# Patient Record
Sex: Female | Born: 1945 | Race: White | Hispanic: No | Smoking: Never smoker
Health system: Southern US, Community
[De-identification: ages and names within clinical notes are randomized; demographics above are authoritative.]

## PROBLEM LIST (undated history)

## (undated) DIAGNOSIS — I341 Nonrheumatic mitral (valve) prolapse: Secondary | ICD-10-CM

## (undated) DIAGNOSIS — C569 Malignant neoplasm of unspecified ovary: Secondary | ICD-10-CM

## (undated) DIAGNOSIS — K56609 Unspecified intestinal obstruction, unspecified as to partial versus complete obstruction: Secondary | ICD-10-CM

## (undated) HISTORY — PX: CHOLECYSTECTOMY: SHX55

## (undated) HISTORY — DX: Nonrheumatic mitral (valve) prolapse: I34.1

## (undated) HISTORY — PX: ABDOMINAL HYSTERECTOMY: SHX81

---

## 2000-10-01 ENCOUNTER — Emergency Department (HOSPITAL_COMMUNITY): Admission: EM | Admit: 2000-10-01 | Discharge: 2000-10-02 | Payer: Self-pay | Admitting: Emergency Medicine

## 2003-04-07 ENCOUNTER — Encounter (INDEPENDENT_AMBULATORY_CARE_PROVIDER_SITE_OTHER): Payer: Self-pay | Admitting: Specialist

## 2003-04-07 ENCOUNTER — Ambulatory Visit (HOSPITAL_COMMUNITY): Admission: RE | Admit: 2003-04-07 | Discharge: 2003-04-07 | Payer: Self-pay | Admitting: *Deleted

## 2003-08-10 ENCOUNTER — Encounter: Admission: RE | Admit: 2003-08-10 | Discharge: 2003-08-10 | Payer: Self-pay | Admitting: Family Medicine

## 2003-10-20 ENCOUNTER — Encounter: Admission: RE | Admit: 2003-10-20 | Discharge: 2003-10-20 | Payer: Self-pay | Admitting: *Deleted

## 2003-10-26 ENCOUNTER — Encounter: Admission: RE | Admit: 2003-10-26 | Discharge: 2003-10-26 | Payer: Self-pay | Admitting: Family Medicine

## 2004-07-24 ENCOUNTER — Other Ambulatory Visit: Admission: RE | Admit: 2004-07-24 | Discharge: 2004-07-24 | Payer: Self-pay | Admitting: Family Medicine

## 2005-04-08 HISTORY — PX: ABDOMINAL EXPLORATION SURGERY: SHX538

## 2006-01-07 ENCOUNTER — Inpatient Hospital Stay (HOSPITAL_COMMUNITY): Admission: EM | Admit: 2006-01-07 | Discharge: 2006-01-15 | Payer: Self-pay | Admitting: Emergency Medicine

## 2006-10-21 ENCOUNTER — Encounter: Admission: RE | Admit: 2006-10-21 | Discharge: 2006-10-21 | Payer: Self-pay | Admitting: Family Medicine

## 2007-12-15 ENCOUNTER — Encounter: Admission: RE | Admit: 2007-12-15 | Discharge: 2007-12-15 | Payer: Self-pay | Admitting: Family Medicine

## 2008-07-15 ENCOUNTER — Other Ambulatory Visit: Admission: RE | Admit: 2008-07-15 | Discharge: 2008-07-15 | Payer: Self-pay | Admitting: Family Medicine

## 2009-10-17 ENCOUNTER — Encounter: Admission: RE | Admit: 2009-10-17 | Discharge: 2009-10-17 | Payer: Self-pay | Admitting: Family Medicine

## 2010-04-28 ENCOUNTER — Encounter: Payer: Self-pay | Admitting: Family Medicine

## 2010-04-29 ENCOUNTER — Encounter: Payer: Self-pay | Admitting: Family Medicine

## 2010-08-24 NOTE — Discharge Summary (Signed)
NAMEMICHAELLE, Zoe Jackson             ACCOUNT NO.:  [id number]   MEDICAL RECORD NO.:  [id number]          PATIENT TYPE:  INP   LOCATION:  1319                         FACILITY:  [facility]   PHYSICIAN:  Angelia Mould. Derrell Lolling, M.D.DATE OF BIRTH:  06-15-1945   DATE OF ADMISSION:  01/07/2006  DATE OF DISCHARGE:  01/15/2006                                 DISCHARGE SUMMARY   FINAL DIAGNOSIS:  1. Mechanical small-bowel obstruction secondary to adhesions.  2. Remote history of ovarian cancer, 1968, no known recurrence.   OPERATIONS PERFORMED:  Exploratory laparotomy with lysis of adhesions, date  January 09, 2006.   HISTORY:  This is a 65 year old white female in excellent health.  She  presented with a 24-hour history of crampy centralized abdominal pain; and  repeated episodes of nausea and vomiting.  She had not had a bowel movement  for over 12 hours.  She came to the emergency room and had CT scan which  suggested a distal small bowel obstruction; and a small amount of pelvic  ascites.  Gallstones were noted, but the gallbladder was not inflamed  radiographically.  I was called see the patient in the emergency room.   PHYSICAL EXAM:  GENERAL:  A pleasant middle-aged woman who was not in any  major distress.  VITAL SIGNS:  Temperature 97.3, pulse 85, respirations 18, blood pressure  138/74.  FOCUSED EXAM:  Significant physical findings limited to the abdomen which  was soft, minimally tender.  No guarding and no rebound; not obviously  distended.  Bowel sounds were hypoactive.  There was a lower midline scar  well healed.  No masses, no hernias noted.   ADMISSION DATA:  White blood cell count 12,000, potassium 3.4, amylase and  lipase were normal.  Otherwise normal labs.   HOSPITAL COURSE:  The patient was admitted for observation.  Because of the  radiographic suggestion of small-bowel obstruction we inserted the NG tube.  On October 3 she had felt okay; was not having any pain, nausea,  or  vomiting, but had not passed any flatus or stool; and NG output was  moderate.  Lab work showed a white count 11,800.  Conservative therapy was  continued.  On October 4 she was having occasional mild cramps.  NG drainage  was somewhat more.  Her abdomen was soft and not obviously distended; not  really tender, but she had almost no bowel sounds.  Her x-rays suggested  small-bowel obstruction.  I felt that she was obstructed; and advised  surgical intervention.  She was taken to operating room on October 4 and was  found to have a mechanical small-bowel obstruction due to a closed loop  obstruction of the distal ileum.  The mesentery was somewhat hemorrhagic,  but the bowel was viable.  There was no signs of any ischemia or cancer.  The gallbladder and appendix were examined they were not inflamed.  We  simply took all of the adhesions down; and that is all that was necessary.   Postoperatively the patient did well.  She had an ileus for a few days.  The  bowel sounds  returned on postop day #3 and the nasogastric tube was removed.  Over the next two or three days her bowel function returned; she began  having bowel movements and tolerating a diet; and she was ready for  discharge on January 15, 2006.  At that time  her wound looked fine; the abdomen was flat and soft and she had good bowel  sounds.  There were no signs of any infection.  She was given a prescription  for Vicodin for pain. Diet and activities were discussed.  She was asked to  followup with me in the office in 1 week.      Angelia Mould. Derrell Lolling, M.D.  Electronically Signed     HMI/MEDQ  D:  02/03/2006  T:  02/03/2006  Job:  [id number]   cc:   Talmadge Coventry, M.D.  Fax: [phone]

## 2010-08-24 NOTE — Op Note (Signed)
NAME:  Zoe Jackson, Zoe Jackson                       ACCOUNT NO.:  [id number]   MEDICAL RECORD NO.:  [id number]                   PATIENT TYPE:  AMB   LOCATION:  ENDO                                 FACILITY:  [facility]   PHYSICIAN:  Graylin Shiver, M.D.                DATE OF BIRTH:  1945-07-23   DATE OF PROCEDURE:  DATE OF DISCHARGE:                                 OPERATIVE REPORT   PROCEDURE:  Colonoscopy.   INDICATIONS FOR PROCEDURE:  Heme-positive stool.   Informed consent was obtained after explanation of the risks of bleeding,  infection and perforation.   PREMEDICATION:  Fentanyl 100 mcg IV, Versed 9 mg IV.   DESCRIPTION OF PROCEDURE:  With the patient in the left lateral decubitus  position, a rectal exam was performed and no masses were felt. The Olympus  colonoscope was then inserted into the rectum and advanced around the colon  to the cecum. Cecal landmarks were identified. The cecum and ascending colon  were normal. The transverse colon was normal.  The descending colon  ___________. In the sigmoid, there was a small 3-4 mm raised area of mucosa  which had a __________ to the mucosa. This might be a small polyp. This was  biopsied off with cold forceps. The rectum looked normal.  She tolerated the  procedure well without complications.   IMPRESSION:  Possible polyps of the sigmoid colon, pathology pending.                                               Graylin Shiver, M.D.    [name]  D:  04/07/2003  T:  04/07/2003  Job:  [id number]   cc:   Dr. ____________________

## 2010-08-24 NOTE — H&P (Signed)
NAMEKLARE, CRISS             ACCOUNT NO.:  [id number]   MEDICAL RECORD NO.:  [id number]          PATIENT TYPE:  INP   LOCATION:  0102                         FACILITY:  [facility]   PHYSICIAN:  Angelia Mould. Derrell Lolling, M.D.DATE OF BIRTH:  1945/06/23   DATE OF ADMISSION:  01/07/2006  DATE OF DISCHARGE:                                HISTORY & PHYSICAL   CHIEF COMPLAINT:  Abdominal pain and vomiting.   HISTORY OF PRESENT ILLNESS:  This is a 65 year old white female who enjoys  excellent health.  She claims that she was feeling well.  Yesterday at 3  o'clock p.m. she drank some orange juice and developed central periumbilical  abdominal pain, shortly thereafter nausea and vomiting.  Sounds like she had  vomiting and dry heaves repeatedly, but it does not sound like she had a  very high volume of emesis.  She had a normal bowel movement and flatus  yesterday but really has not had any bowel movement or flatus for the past  12 hours.  She has not had any similar episodes in the past.  She came to  emergency room at midnight last night.  She had a CT scan which suggests a  distal small-bowel obstruction, some pelvic ascites although this is not  high volume, and gallstones but the gallbladder does not look inflamed.  We  were called at 7 o'clock a.m. to evaluate the patient.  I saw the patient at  7:30 a.m. and again at 9:30 a.m.Marland Kitchen  She is being admitted for further  evaluation and management.   PAST HISTORY:  She states that in 1968 she had ovarian cancer and had a  hysterectomy and bilateral salpingo-oophorectomy.  There has been no  recurrence.  She has no other medical or surgical problems.   CURRENT MEDICATIONS:  None.   ALLERGIES:  NO KNOWN DRUG ALLERGIES.   SOCIAL HISTORY:  [place]  She is married.  They do not have any children.  She is self-employed as a Advertising copywriter.  Denies tobacco.  Drinks wine occasionally.   FAMILY HISTORY:  Mother living, has  Parkinson's disease and hypertension.  Father living, has hypertension.   REVIEW OF SYSTEMS:  All systems reviewed.  They are noncontributory except  as described above.   PHYSICAL EXAMINATION:  GENERAL APPEARANCE:  Pleasant middle-aged woman who  really does not appear to be in any distress.  She appears healthy.  VITAL SIGNS:  Temperature 97.3, pulse 85, respirations 18, blood pressure  138/74.  HEENT:  Eyes:  Sclerae clear.  Extraocular movements intact.  Ears, nose  mouth and throat:  Nose, lips, tongue and oropharynx are without gross  lesion.  NECK:  Supple, nontender.  No thyroid mass.  No adenopathy.  No jugular  venous distention.  LUNGS:  Clear to auscultation.  No chest wall tenderness.  HEART:  Regular rate and rhythm.  Radial and femoral pulses are palpable.  No peripheral edema.  BREASTS:  Not examined.  ABDOMEN:  Soft.  Minimally tender.  No guarding, no rebound not obviously  distended.  Bowel sounds are hypoactive, not  obstructive sounding.  There is  a lower midline scar which is well-healed.  I do not feel any mass in the  abdomen.  Liver and spleen are not enlarged.  I do not feel any inguinal  adenopathy or hernia or mass.  EXTREMITIES:  She moves all four extremities well without pain or deformity.  NEUROLOGIC:  No gross motor or sensory deficits.   ADMISSION LABORATORY DATA:  CT scan as described above.   Hemoglobin 14.4, white blood cell count 12,000.  Complete metabolic panel  reveals potassium of 3.4, normal liver function tests.  Amylase of 71,  lipase of 29 and an unremarkable urinalysis.   ASSESSMENT:  1. Abdominal pain and vomiting.  This may be due to a partial small-bowel      obstruction or a gastroenteritis or viral illness.  2. Remote history of ovarian cancer.  Specific details unknown, no known      recurrence today.  3. Gallstones, suspect these are asymptomatic.   PLAN:  1. The patient will be admitted to the hospital for IV fluid  hydration and      nasogastric tube decompression.  2. We will repeat her clinical evaluation in 12-24 hours with lab and x-      ray and physical exam.      Angelia Mould. Derrell Lolling, M.D.  Electronically Signed     HMI/MEDQ  D:  01/07/2006  T:  01/08/2006  Job:  [id number]   cc:   Talmadge Coventry, M.D.  Fax: [phone]

## 2010-08-24 NOTE — Op Note (Signed)
Zoe Jackson, Zoe Jackson             ACCOUNT NO.:  [id number]   MEDICAL RECORD NO.:  [id number]          PATIENT TYPE:  INP   LOCATION:  1319                         FACILITY:  [facility]   PHYSICIAN:  Angelia Mould. Derrell Lolling, M.D.DATE OF BIRTH:  1946-02-27   DATE OF PROCEDURE:  01/09/2006  DATE OF DISCHARGE:                                 OPERATIVE REPORT   PREOPERATIVE DIAGNOSIS:  Small bowel obstruction.   POSTOPERATIVE DIAGNOSIS:  Closed loop small bowel obstruction secondary to  adhesions.   OPERATION PERFORMED:  Exploratory laparotomy, lysis of adhesions.   SURGEON:  Dr. Claud Kelp.   FIRST ASSISTANT:  Dr. Anselm Pancoast. [place].   OPERATIVE INDICATIONS:  This is a 65 year old white female who is in  excellent health.  She had a hysterectomy and bilateral salpingo-  oophorectomy in 1968.  She was told she had ovarian cancer at that time.  She has had no further problems.  She was admitted in the early morning  hours of January 07, 2006 with abdominal pain, nausea and vomiting.  Her  abdomen was not that tender but CT scan suggested a distal small bowel  obstruction.  A nasogastric tube was placed and the patient became  comfortable but did not resume bowel function.  X-rays yesterday still  suggested a small bowel obstruction but she had good bowel sounds and was  feeling fine.  This morning she also felt well but her bowel sounds went  away and her x-rays again showed obstruction.  She is brought to the  operating room for exploration.   OPERATIVE FINDINGS:  The patient had a closed loop obstruction of the distal  ileum involving about 2-1/2 feet of terminal ileum.  This was caught in an  adhesion and the bowel was quite hemorrhagic especially in the mesentery but  the bowel was clearly viable and after observing it for about 10 minutes, it  was quite pink and had good peristalsis but the mesentery was hemorrhagic.  There was no active bleeding.  We ran the small intestine from  the ligament  of Treitz all the way to the ileocecal valve and there were no other  adhesions or points of obstruction.  The appendix was present but was not  inflamed.  The gallbladder was present but was not inflamed.  The liver felt  normal.  The stomach felt normal.  The pelvis felt normal.  The omentum felt  normal.  There was no sign of any cancer.  There was probably about 1500 mL  of ascites within the abdomen and the pelvis presumably secondary to the  obstruction.   OPERATIVE TECHNIQUE:  Following the induction of general endotracheal  anesthesia, the patient's abdomen was prepped and draped in a sterile  fashion.  Midline laparotomy was performed through the old scar.  The  abdomen was entered and explored with findings as described above.  The  ascites was evacuated.  Careful examination of the ileum in the right lower  quadrant revealed a hemorrhagic bowel and we very quickly found an adhesive  band which was lysed and that freed up the small bowel.  We divided some of  the lateral peritoneal attachments of the cecum to mobilize it medially just  a little bit to help with the dissection.  The appendix was normal.  We  examined the small bowel from the ileocecal valve proximally to the terminal  ileum and then back down again and found no other abnormalities.  We  irrigated out the abdomen and pelvis with about 3 liters of warm saline.  We  observed the small bowel during this time and it became quite healthy  appearing with good peristalsis, there was no sign of any ischemic patches  or gangrene.  We did not feel that any resection was necessary.  After  evacuating all of the irrigation fluid, we returned the small bowel and  omentum to its anatomic position.  The midline fascia was closed  with a running suture of #1 PDS and the skin closed with skin staples.  Clean bandages were placed and the patient taken to the recovery room in  stable condition.  Estimated blood loss  was about 50 mL or less.  Complications none. Sponge, needle and instrument counts were correct.      Angelia Mould. Derrell Lolling, M.D.  Electronically Signed     HMI/MEDQ  D:  01/09/2006  T:  01/11/2006  Job:  [id number]   cc:   Talmadge Coventry, M.D.  Fax: [phone]

## 2012-06-02 ENCOUNTER — Other Ambulatory Visit: Payer: Self-pay | Admitting: Family Medicine

## 2012-06-02 DIAGNOSIS — M810 Age-related osteoporosis without current pathological fracture: Secondary | ICD-10-CM

## 2012-06-02 DIAGNOSIS — Z1231 Encounter for screening mammogram for malignant neoplasm of breast: Secondary | ICD-10-CM

## 2012-07-06 ENCOUNTER — Ambulatory Visit
Admission: RE | Admit: 2012-07-06 | Discharge: 2012-07-06 | Disposition: A | Discharge status: Home / Self Care | Payer: Medicare Other | Source: Ambulatory Visit | Attending: Family Medicine | Admitting: Family Medicine

## 2012-07-06 DIAGNOSIS — Z1231 Encounter for screening mammogram for malignant neoplasm of breast: Secondary | ICD-10-CM

## 2012-07-06 DIAGNOSIS — M810 Age-related osteoporosis without current pathological fracture: Secondary | ICD-10-CM

## 2013-03-12 ENCOUNTER — Encounter (HOSPITAL_COMMUNITY): Payer: Self-pay | Admitting: Emergency Medicine

## 2013-03-12 ENCOUNTER — Inpatient Hospital Stay (HOSPITAL_COMMUNITY): Payer: Medicare Other

## 2013-03-12 ENCOUNTER — Inpatient Hospital Stay (HOSPITAL_COMMUNITY)
Admission: EM | Admit: 2013-03-12 | Discharge: 2013-03-16 | DRG: 438 | Disposition: A | Discharge status: Home / Self Care | Payer: Medicare Other | Attending: Internal Medicine | Admitting: Internal Medicine

## 2013-03-12 DIAGNOSIS — R7401 Elevation of levels of liver transaminase levels: Secondary | ICD-10-CM | POA: Diagnosis present

## 2013-03-12 DIAGNOSIS — Z8543 Personal history of malignant neoplasm of ovary: Secondary | ICD-10-CM

## 2013-03-12 DIAGNOSIS — R17 Unspecified jaundice: Secondary | ICD-10-CM

## 2013-03-12 DIAGNOSIS — K859 Acute pancreatitis without necrosis or infection, unspecified: Principal | ICD-10-CM | POA: Diagnosis present

## 2013-03-12 DIAGNOSIS — K8020 Calculus of gallbladder without cholecystitis without obstruction: Secondary | ICD-10-CM

## 2013-03-12 DIAGNOSIS — R7402 Elevation of levels of lactic acid dehydrogenase (LDH): Secondary | ICD-10-CM | POA: Diagnosis present

## 2013-03-12 DIAGNOSIS — K831 Obstruction of bile duct: Secondary | ICD-10-CM | POA: Diagnosis present

## 2013-03-12 DIAGNOSIS — K219 Gastro-esophageal reflux disease without esophagitis: Secondary | ICD-10-CM | POA: Diagnosis present

## 2013-03-12 DIAGNOSIS — E876 Hypokalemia: Secondary | ICD-10-CM | POA: Diagnosis present

## 2013-03-12 DIAGNOSIS — R748 Abnormal levels of other serum enzymes: Secondary | ICD-10-CM

## 2013-03-12 DIAGNOSIS — E871 Hypo-osmolality and hyponatremia: Secondary | ICD-10-CM | POA: Diagnosis present

## 2013-03-12 HISTORY — DX: Unspecified intestinal obstruction, unspecified as to partial versus complete obstruction: K56.609

## 2013-03-12 HISTORY — DX: Malignant neoplasm of unspecified ovary: C56.9

## 2013-03-12 LAB — TSH: TSH: 2.72 u[IU]/mL (ref 0.350–4.500)

## 2013-03-12 LAB — URINALYSIS, ROUTINE W REFLEX MICROSCOPIC
Hgb urine dipstick: NEGATIVE
Ketones, ur: 40 mg/dL — AB
Leukocytes, UA: NEGATIVE
Nitrite: NEGATIVE
Specific Gravity, Urine: 1.008 (ref 1.005–1.030)
pH: 6.5 (ref 5.0–8.0)

## 2013-03-12 LAB — MAGNESIUM: Magnesium: 2 mg/dL (ref 1.5–2.5)

## 2013-03-12 LAB — COMPREHENSIVE METABOLIC PANEL
ALT: 459 U/L — ABNORMAL HIGH (ref 0–35)
Alkaline Phosphatase: 165 U/L — ABNORMAL HIGH (ref 39–117)
BUN: 7 mg/dL (ref 6–23)
Calcium: 9 mg/dL (ref 8.4–10.5)
Chloride: 95 mEq/L — ABNORMAL LOW (ref 96–112)
GFR calc Af Amer: >90 mL/min (ref >90)
Glucose, Bld: 110 mg/dL — ABNORMAL HIGH (ref 70–99)
Potassium: 3.3 mEq/L — ABNORMAL LOW (ref 3.5–5.1)
Sodium: 131 mEq/L — ABNORMAL LOW (ref 135–145)
Total Bilirubin: 5.3 mg/dL — ABNORMAL HIGH (ref 0.3–1.2)
Total Protein: 7.6 g/dL (ref 6.0–8.3)

## 2013-03-12 LAB — CBC WITH DIFFERENTIAL/PLATELET
Eosinophils Absolute: 0 10*3/uL (ref 0.0–0.7)
Hemoglobin: 14.7 g/dL (ref 12.0–15.0)
Lymphocytes Relative: 11 % — ABNORMAL LOW (ref 12–46)
Lymphs Abs: 0.7 10*3/uL (ref 0.7–4.0)
MCH: 31.5 pg (ref 26.0–34.0)
MCV: 89.1 fL (ref 78.0–100.0)
Monocytes Absolute: 0.4 10*3/uL (ref 0.1–1.0)
Monocytes Relative: 7 % (ref 3–12)
Neutro Abs: 5 10*3/uL (ref 1.7–7.7)
Neutrophils Relative %: 82 % — ABNORMAL HIGH (ref 43–77)
Platelets: 254 10*3/uL (ref 150–400)
RBC: 4.66 MIL/uL (ref 3.87–5.11)
WBC: 6.1 10*3/uL (ref 4.0–10.5)

## 2013-03-12 LAB — LIPID PANEL
Cholesterol: 195 mg/dL (ref 0–200)
HDL: 82 mg/dL (ref >39)
Total CHOL/HDL Ratio: 2.4 RATIO
Triglycerides: 52 mg/dL (ref <150)
VLDL: 10 mg/dL (ref 0–40)

## 2013-03-12 LAB — BILIRUBIN, FRACTIONATED(TOT/DIR/INDIR): Total Bilirubin: 5.2 mg/dL — ABNORMAL HIGH (ref 0.3–1.2)

## 2013-03-12 LAB — PHOSPHORUS: Phosphorus: 2.6 mg/dL (ref 2.3–4.6)

## 2013-03-12 LAB — LIPASE, BLOOD: Lipase: 1317 U/L — ABNORMAL HIGH (ref 11–59)

## 2013-03-12 MED ORDER — KCL IN DEXTROSE-NACL 20-5-0.45 MEQ/L-%-% IV SOLN
INTRAVENOUS | Status: DC
  Filled 2013-03-12 (×2): qty 1000

## 2013-03-12 MED ORDER — ONDANSETRON HCL 4 MG PO TABS: 4.0000 mg | ORAL_TABLET | Freq: Four times a day (QID) | ORAL | Status: DC | PRN

## 2013-03-12 MED ORDER — POTASSIUM CHLORIDE 10 MEQ/100ML IV SOLN
10.0000 meq | INTRAVENOUS | Status: AC
  Administered 2013-03-12 (×3): 10 meq via INTRAVENOUS
  Filled 2013-03-12 (×2): qty 100

## 2013-03-12 MED ORDER — ACETAMINOPHEN 325 MG PO TABS: 650.0000 mg | ORAL_TABLET | Freq: Four times a day (QID) | ORAL | Status: DC | PRN

## 2013-03-12 MED ORDER — PANTOPRAZOLE SODIUM 40 MG IV SOLR
40.0000 mg | Freq: Two times a day (BID) | INTRAVENOUS | Status: DC
  Administered 2013-03-12 – 2013-03-16 (×9): 40 mg via INTRAVENOUS
  Filled 2013-03-12 (×10): qty 40

## 2013-03-12 MED ORDER — POTASSIUM CHLORIDE 10 MEQ/100ML IV SOLN
10.0000 meq | Freq: Once | INTRAVENOUS | Status: AC
  Administered 2013-03-12: 10 meq via INTRAVENOUS
  Filled 2013-03-12: qty 100

## 2013-03-12 MED ORDER — ONDANSETRON HCL 4 MG/2ML IJ SOLN: 4.0000 mg | Freq: Three times a day (TID) | INTRAMUSCULAR | Status: DC | PRN

## 2013-03-12 MED ORDER — ONDANSETRON HCL 4 MG/2ML IJ SOLN: 4.0000 mg | Freq: Four times a day (QID) | INTRAMUSCULAR | Status: DC | PRN

## 2013-03-12 MED ORDER — SODIUM CHLORIDE 0.9 % IV SOLN
Freq: Once | INTRAVENOUS | Status: AC
  Administered 2013-03-12: 06:00:00 via INTRAVENOUS

## 2013-03-12 MED ORDER — PANTOPRAZOLE SODIUM 40 MG IV SOLR
40.0000 mg | Freq: Once | INTRAVENOUS | Status: AC
  Administered 2013-03-12: 40 mg via INTRAVENOUS
  Filled 2013-03-12: qty 40

## 2013-03-12 MED ORDER — HEPARIN SODIUM (PORCINE) 5000 UNIT/ML IJ SOLN
5000.0000 [IU] | Freq: Three times a day (TID) | INTRAMUSCULAR | Status: DC
  Administered 2013-03-12 – 2013-03-16 (×12): 5000 [IU] via SUBCUTANEOUS
  Filled 2013-03-12 (×15): qty 1

## 2013-03-12 MED ORDER — SODIUM CHLORIDE 0.9 % IV SOLN: INTRAVENOUS | Status: DC

## 2013-03-12 MED ORDER — ONDANSETRON HCL 4 MG/2ML IJ SOLN
4.0000 mg | Freq: Once | INTRAMUSCULAR | Status: AC
  Administered 2013-03-12: 4 mg via INTRAVENOUS
  Filled 2013-03-12: qty 2

## 2013-03-12 MED ORDER — WHITE PETROLATUM GEL
Status: AC
  Filled 2013-03-12: qty 5

## 2013-03-12 MED ORDER — ACETAMINOPHEN 650 MG RE SUPP: 650.0000 mg | Freq: Four times a day (QID) | RECTAL | Status: DC | PRN

## 2013-03-12 MED ORDER — MORPHINE SULFATE 2 MG/ML IJ SOLN: 2.0000 mg | INTRAMUSCULAR | Status: DC | PRN

## 2013-03-12 MED ORDER — HYDROMORPHONE HCL PF 1 MG/ML IJ SOLN: 1.0000 mg | INTRAMUSCULAR | Status: DC | PRN

## 2013-03-12 MED ORDER — SODIUM CHLORIDE 0.9 % IV SOLN
INTRAVENOUS | Status: DC
  Administered 2013-03-12 – 2013-03-16 (×10): via INTRAVENOUS

## 2013-03-12 NOTE — ED Notes (Signed)
MD at bedside. 

## 2013-03-12 NOTE — H&P (Signed)
[facility] History and Physical  Zoe Jackson YNW:[id number] DOB: June 08, 1945 DOA: 03/12/2013  Referring physician: Dr. Preston Fleeting PCP: [name], MD  Specialists: Dr. Evette Cristal (GI)  Chief Complaint: abd pain, nausea, vomiting  HPI: Zoe Jackson is a 67 y.o. female with PMH of ovarian cancer (s/p hysterectomy and bilat salpingo-oforectomy), SBO with needs for surgical lysis of adhesions (Done by Dr. Derrell Lolling) and cholelithiasis diagnosed in 2005 and confirmed with CT abd in 2007; came to Ed complaining of epigastric and RUQ pain with associated nausea and vomiting. Patient reports symptoms present for the last 3 days or so, on/off initially and worse on day of admission. Patient denies CP, SOB, cough, hematemesis, melena, fever chills or any other complaints. In ED patient had elevated bilirubin, elevated LFT's and a lipase in the 1300 range. Abd US demonstrated positive intra/extrahepatic biliary and CBD dilatation. TRH called to admit for further evaluation and treatment.    Review of Systems:  Negative except as mentioned on HPI.  Past Medical History  Diagnosis Date  . SBO (small bowel obstruction)   . Ovarian cancer    History reviewed. No pertinent past surgical history.  Social History:  reports that she has never smoked. She does not have any smokeless tobacco history on file. She reports that she does not drink alcohol or use illicit drugs.   Allergies  Allergen Reactions  . Azithromycin Other (See Comments)    "turned tongue black"    Family hx: significant only for HTN according to Patient  Prior to Admission medications   Not on File   Physical Exam: Filed Vitals:   03/12/13 0951  BP: 130/59  Pulse: 74  Temp:   Resp: 18     General:  NAD, afebrile and cooperative to examination. Reports epigastric/RUQ pain and some nausea  Eyes: PERRL, mild icterus, no nystagmus, EOMI  ENT: dry MM, no erythema or exudates inside her mouth; no drainage out of  ears or nostrils  Neck: no JVD, supple and w/o thyromegaly  Cardiovascular: S1 and S2, RRR, no rubs or gallops  Respiratory: CTA bilaterally  Abdomen: soft, ND, no guarding; positive pain with palpation on her RUQ and epigastric area.  Skin: no rash or petechiae  Musculoskeletal: no edema, cyanosis or clubbing; FROM  Psychiatric: no SI, no hallucinations  Neurologic: AAOX3, CN intact, no focal motor or sensory deficit.  Labs on Admission:  Basic Metabolic Panel:  Recent Labs Lab 03/12/13 0600  NA 131*  K 3.3*  CL 95*  CO2 24  GLUCOSE 110*  BUN 7  CREATININE 0.55  CALCIUM 9.0   Liver Function Tests:  Recent Labs Lab 03/12/13 0600  AST 331*  ALT 459*  ALKPHOS 165*  BILITOT 5.3*  PROT 7.6  ALBUMIN 3.8    Recent Labs Lab 03/12/13 0600  LIPASE 1317*   CBC:  Recent Labs Lab 03/12/13 0600  WBC 6.1  NEUTROABS 5.0  HGB 14.7  HCT 41.5  MCV 89.1  PLT 254    Radiological Exams on Admission: US Abdomen Complete  03/12/2013   CLINICAL DATA:  Abdominal pain, abnormal LFTs and lipase. Concern for choledocholithiasis or gallstone pancreatitis.  EXAM: ULTRASOUND ABDOMEN COMPLETE  COMPARISON:  CT abdomen / pelvis 01/07/2006  FINDINGS: Gallbladder:  Mobile echogenic shadowing foci within the gallbladder lumen consistent with cholelithiasis. No gallbladder wall thickening or pericholecystic fluid. Per the sonographer, the sonographic Eulah Pont sign was negative. A small echogenic focus is identified in the neck of the gallbladder, or proximal cystic  duct. This is unlikely to be obstructing given the other findings described above.  Common bile duct:  Diameter: Dilated at 13 mm. Common bile duct is well seen to the upper pancreatic head. No choledocholithiasis. The most distal common bile duct is not seen.  Liver:  Mild intrahepatic biliary ductal dilatation. Fairly well defined 2 cm hyperechoic area in the posterior right hepatic lobe corresponds with a hemangioma seen on a  prior CT scan. No additional hepatic lesion identified.  IVC:  No abnormality visualized.  Pancreas:  Pancreatic head is a very mildly hypoechoic which is nonspecific but can be seen with edema. No focal mass. Minimally prominent pancreatic duct at 2.4 mm.  Spleen:  Size and appearance within normal limits.  Right Kidney:  Length: 11.9 cm. Echogenicity within normal limits. No mass or hydronephrosis visualized.  Left Kidney:  Length: 12.5 cm. Echogenicity within normal limits. No mass or hydronephrosis visualized.  Abdominal aorta:  No aneurysm visualized.  Other findings:  None.  IMPRESSION: 1. Intra and extrahepatic biliary ductal dilatation in the setting of cholelithiasis is concerning for distal choledocholithiasis versus a distal biliary stricture. A pancreatic or ampullary mass is also a possibility. Recommend further evaluation with MRCP or ERCP. 2. Cholelithiasis without sonographic features of cholecystitis. 3. Mild hypo echogenicity of the pancreatic head as can be seen with edema in the setting of pancreatitis. 4. Small benign hemangioma in the right hepatic lobe.   Electronically Signed   By: Malachy Moan M.D.   On: 03/12/2013 10:36    EKG:  None  Assessment/Plan 1-abd pain and nausea, due to acute Pancreatitis: appears to be secondary to gallstones. Patient with known hx of cholelithiasis in the past. Denies fever, chills and WBC's not elevated; doubt acute cholecystitis. -will admit to med-surg -aggressive IVF's -PRN analgesics and antiemetics -will check lipid panel -[place] positive for CBD obstruction and intra/extrahepatic dilatation; will get MRCP and ask GI to see patient. -surgery consulted as patient will like to have gallbladder taken out (according to patient she has been experiencing on/off symptoms of nausea/vomiting, early satiated, RUQ, right shoulder and back pain since August and will like to have a definitive treatment for her cholelithiasis) -will follow rec's    2-Hypokalemia and Hyponatremia: due to dehydration and poor intake. -will provide IVF's -electrolyte repletion. -check Mg  3-Transaminitis: with concerns for CBD vs just gallstone passage. -positive [place] for intra/extrahepatic dilatation and concerns for CBD obstruction -will get MRCP -GI will be consulted  4-Cholelithiasis: known for almost 8-9 years now; intermittently experiencing symptoms of RUA, nausea and vomiting. -surgery consulted -will follow rec's  5-GERD (gastroesophageal reflux disease): continue PPI  DVT: heparin.   Surgery [facility] surgery) GI (Dr. Matthias Hughs)  Code Status: Full Family Communication: no family at bedside Disposition Plan: inpatient, LOS > 2 midnights, medsurg  Time spent: 50 minutes  Cahterine Heinzel [facility] Pager [phone]  If 7PM-7AM, please contact night-coverage [email or web address] Password [facility] 03/12/2013, 10:47 AM

## 2013-03-12 NOTE — Consult Note (Signed)
Agree with A&P of KO,PA. Patient quite comfortable now, benign abdomen, has good understanding of the plans for MRCP, possible ERCP and likely cholecystectomy. Dr Derrell Lolling has operated apparently for SBO from adhesions and Dr Gerrit Friends talked with her several years ago about removing the GB, but she was asymptomatic at the time

## 2013-03-12 NOTE — ED Notes (Signed)
Remains in [place]

## 2013-03-12 NOTE — Consult Note (Signed)
Referring Provider: Dr. Vassie Loll Primary Care Physician:  [name], MD Primary Gastroenterologist:  Dr. Evette Cristal  Reason for Consultation:  Gallstone pancreatitis  HPI: Zoe Jackson is a 67 y.o. female admitted to the emergency room early this morning following a 2 day prodrome of epigastric and right upper quadrant pressure. She was actually able to go to work yesterday. She has a history of gallstones, which she has known about for the past 9 years, and is beginning to give intermittent symptoms about 4 months ago. In the emergency room, he was noted that the patient had a lipase level MCCC, with transaminases in the 3-500 range, bilirubin 5.3, alkaline phosphatase 165, white count normal, hemoglobin 14.7, renal function normal.  An ultrasound on admission showed a 13 mm common bile duct with an element of intrahepatic biliary ductal dilatation, but no evidence of a stone within the bile duct.  Since coming into the hospital, she is doing much better, with a marked decrease in her abdominal pain. She has not been requiring narcotic pain medication.   Past Medical History  Diagnosis Date  . SBO (small bowel obstruction)   . Ovarian cancer     Past Surgical History  Procedure Laterality Date  . Abdominal hysterectomy  40 yrs ago    TAH  . Abdominal exploration surgery  2007    LOA    Prior to Admission medications   Not on File    Current Facility-Administered Medications  Medication Dose Route Frequency Provider Last Rate Last Dose  . 0.9 %  sodium chloride infusion   Intravenous Continuous Vassie Loll, MD 200 mL/hr at 03/12/13 1637    . acetaminophen (TYLENOL) tablet 650 mg  650 mg Oral Q6H PRN Vassie Loll, MD       Or  . acetaminophen (TYLENOL) suppository 650 mg  650 mg Rectal Q6H PRN Vassie Loll, MD      . heparin injection 5,000 Units  5,000 Units Subcutaneous Q8H Vassie Loll, MD   5,000 Units at 03/12/13 1316  . morphine 2 MG/ML injection 2-3 mg  2-3  mg Intravenous Q3H PRN Vassie Loll, MD      . ondansetron [facility]) tablet 4 mg  4 mg Oral Q6H PRN Vassie Loll, MD       Or  . ondansetron [facility]) injection 4 mg  4 mg Intravenous Q6H PRN Vassie Loll, MD      . pantoprazole (PROTONIX) injection 40 mg  40 mg Intravenous Q12H Vassie Loll, MD   40 mg at 03/12/13 1316  . white petrolatum (VASELINE) gel             Allergies as of 03/12/2013 - Review Complete 03/12/2013  Allergen Reaction Noted  . Azithromycin Other (See Comments) 03/12/2013    History reviewed. No pertinent family history.  History   Social History  . Marital Status: Married    Spouse Name: N/A    Number of Children: N/A  . Years of Education: N/A   Occupational History  . Not on file.   Social History Main Topics  . Smoking status: Never Smoker   . Smokeless tobacco: Not on file  . Alcohol Use: No  . Drug Use: No  . Sexual Activity: Not on file   Other Topics Concern  . Not on file   Social History Narrative  . No narrative on file    Review of Systems:  Generally negative for antecedent GI tract symptoms such as reflux, chronic abdominal pain, constipation, diarrhea,  rectal bleeding, or unintentional weight loss. As noted, she has had intermittent symptoms attributed to her gallstones over the past several months.  Physical Exam: Vital signs in last 24 hours: Temp:  [98.1 F (36.7 C)-99.5 F (37.5 C)] 99.5 F (37.5 C) (12/05 1358) Pulse Rate:  [67-75] 75 (12/05 1358) Resp:  [14-18] 17 (12/05 1358) BP: (130-168)/(59-93) 140/69 mmHg (12/05 1358) SpO2:  [97 %-100 %] 97 % (12/05 1358) Weight:  [61.236 kg (135 lb)] 61.236 kg (135 lb) (12/05 1037)   General:   Alert,  Well-developed, well-nourished, pleasant and cooperative in NAD Head:  Normocephalic and atraumatic. Eyes:  Sclera clear, no icterus.   Conjunctiva pink. Mouth:   No ulcerations or lesions.  Oropharynx pink & moist. Neck:   No masses or thyromegaly. Lungs:  Clear throughout to  auscultation.   No wheezes, crackles, or rhonchi. No evident respiratory distress. Heart:   Regular rate and rhythm; no murmurs, clicks, rubs,  or gallops. Abdomen:  Soft, nontender, nontympanitic, and nondistended. No masses, hepatosplenomegaly or ventral hernias noted. Normal bowel sounds, without bruits, guarding, or rebound.   Rectal:  Not performed, but Hemoccult negative on admission Msk:   Symmetrical without gross deformities. Extremities:   Without clubbing, cyanosis, or edema. Neurologic:  Alert and coherent;  grossly normal neurologically. Skin:  Intact without significant lesions or rashes. Cervical Nodes:  No significant cervical adenopathy. Psych:   Alert and cooperative. Normal mood and affect.  Intake/Output from previous day:   Intake/Output this shift:    Lab Results:  Recent Labs  03/12/13 0600  WBC 6.1  HGB 14.7  HCT 41.5  PLT 254   BMET  Recent Labs  03/12/13 0600  NA 131*  K 3.3*  CL 95*  CO2 24  GLUCOSE 110*  BUN 7  CREATININE 0.55  CALCIUM 9.0   LFT  Recent Labs  03/12/13 0600 03/12/13 1248  PROT 7.6  --   ALBUMIN 3.8  --   AST 331*  --   ALT 459*  --   ALKPHOS 165*  --   BILITOT 5.3* 5.2*  BILIDIR  --  4.1*  IBILI  --  1.1*   PT/INR No results found for this basename: LABPROT, INR,  in the last 72 hours  Studies/Results: US Abdomen Complete  03/12/2013   CLINICAL DATA:  Abdominal pain, abnormal LFTs and lipase. Concern for choledocholithiasis or gallstone pancreatitis.  EXAM: ULTRASOUND ABDOMEN COMPLETE  COMPARISON:  CT abdomen / pelvis 01/07/2006  FINDINGS: Gallbladder:  Mobile echogenic shadowing foci within the gallbladder lumen consistent with cholelithiasis. No gallbladder wall thickening or pericholecystic fluid. Per the sonographer, the sonographic Eulah Pont sign was negative. A small echogenic focus is identified in the neck of the gallbladder, or proximal cystic duct. This is unlikely to be obstructing given the other findings  described above.  Common bile duct:  Diameter: Dilated at 13 mm. Common bile duct is well seen to the upper pancreatic head. No choledocholithiasis. The most distal common bile duct is not seen.  Liver:  Mild intrahepatic biliary ductal dilatation. Fairly well defined 2 cm hyperechoic area in the posterior right hepatic lobe corresponds with a hemangioma seen on a prior CT scan. No additional hepatic lesion identified.  IVC:  No abnormality visualized.  Pancreas:  Pancreatic head is a very mildly hypoechoic which is nonspecific but can be seen with edema. No focal mass. Minimally prominent pancreatic duct at 2.4 mm.  Spleen:  Size and appearance within normal limits.  Right Kidney:  Length: 11.9 cm. Echogenicity within normal limits. No mass or hydronephrosis visualized.  Left Kidney:  Length: 12.5 cm. Echogenicity within normal limits. No mass or hydronephrosis visualized.  Abdominal aorta:  No aneurysm visualized.  Other findings:  None.  IMPRESSION: 1. Intra and extrahepatic biliary ductal dilatation in the setting of cholelithiasis is concerning for distal choledocholithiasis versus a distal biliary stricture. A pancreatic or ampullary mass is also a possibility. Recommend further evaluation with MRCP or ERCP. 2. Cholelithiasis without sonographic features of cholecystitis. 3. Mild hypo echogenicity of the pancreatic head as can be seen with edema in the setting of pancreatitis. 4. Small benign hemangioma in the right hepatic lobe.   Electronically Signed   By: Malachy Moan M.D.   On: 03/12/2013 10:36    Impression: 1. Pancreatitis, with clinically mild presentation 2. Dilated biliary system 3. Elevated liver chemistries 4. Cholelithiasis 5. History of ovarian cancer with previous small bowel obstruction  Plan: 1. MRCP to check for common duct stone 2. Would favor aggressive IV fluid supplementation as previously discussed, probably a minimum of 300 mL per hour for the next 12-24 hours 3. I  would recommend surgical consultation. Hopefully, this patient will improve rapidly and be able to have cholecystectomy in the next several days. However, it is possible for even patient's like this, "good" at the time of presentation, to go downhill and develop SIRS, so it is necessary to be vigilant.  4. Follow labs  5. If the patient's labs normalize promptly, and her clinical symptoms continue to improve, and if the MRCP is negative for stones, I do not think she would need a preoperative ERCP.     LOS: 0 days   Bricelyn Freestone V  03/12/2013, 4:52 PM

## 2013-03-12 NOTE — Consult Note (Signed)
Zoe Jackson 05-27-1945  [id number].   Primary Care MD: Dr. Laurann Montana Requesting MD: Dr. Vassie Loll Chief Complaint/Reason for Consult: gallstone pancreatitis HPI: This is a 67 yo white female with a h/o ovarian cancer s/p TAH (40 yrs ago) and SBO with LOA in 2007.  She has had known gallstones for about 9 years she says.  Normally, she doesn't have any issues; however, her diet controls this quite well.  Back in August she ate one piece of pepperoni pizza.  After that she has begun having more frequent RUQ pressure and pain.  She denies any nausea or vomiting. Over the last couple of days she started having more pain in the RUQ and now in the epigastrium.  She noted some darkness of her urine the past 2 days, but no change in her stool or skin color.  She came to the [facility] today for further evaluation due to continued troubles with her abdominal pain.  She was noted to have gallstones and intra and extra hepatic ductal dilatation.  No evidence of cholecystitis.  She has a TB of 5.3 and elevated LFTS along with a lipase of 1300.  She was admitted by medicine and we were asked to see her for further evaluation.  ROS: Please see HPI, otherwise all other systems have been reviewed and are negative  History reviewed. No pertinent family history.  Past Medical History  Diagnosis Date  . SBO (small bowel obstruction)   . Ovarian cancer     Past Surgical History  Procedure Laterality Date  . Abdominal hysterectomy  40 yrs ago    TAH  . Abdominal exploration surgery  2007    LOA    Social History:  reports that she has never smoked. She does not have any smokeless tobacco history on file. She reports that she does not drink alcohol or use illicit drugs.  Allergies:  Allergies  Allergen Reactions  . Azithromycin Other (See Comments)    "turned tongue black"    No prescriptions prior to admission    Blood pressure 135/65, pulse 67, temperature 98.2 F (36.8 C), temperature  source Oral, resp. rate 16, height 5\' 6"  (1.676 m), weight 135 lb (61.236 kg), SpO2 99.00%. Physical Exam: General: pleasant, WD, WN white female who is laying in bed in NAD HEENT: head is normocephalic, atraumatic.  Sclera are noninjected.  PERRL.  Ears and nose without any masses or lesions.  Mouth is pink and moist Heart: regular, rate, and rhythm.  Normal s1,s2. No obvious murmurs, gallops, or rubs noted.  Palpable radial and pedal pulses bilaterally Lungs: CTAB, no wheezes, rhonchi, or rales noted.  Respiratory effort nonlabored Abd: soft, mildly tender in RUQ and epigastrium, ND, +BS, no masses, hernias, or organomegaly MS: all 4 extremities are symmetrical with no cyanosis, clubbing, or edema. Skin: warm and dry with no masses, lesions, or rashes Psych: A&Ox3 with an appropriate affect.    Results for orders placed during the hospital encounter of 03/12/13 (from the past 48 hour(s))  OCCULT BLOOD, POC DEVICE     Status: None   Collection Time    03/12/13  5:32 AM      Result Value Range   Fecal Occult Bld NEGATIVE  NEGATIVE  CBC WITH DIFFERENTIAL     Status: Abnormal   Collection Time    03/12/13  6:00 AM      Result Value Range   WBC 6.1  4.0 - 10.5 K/uL   RBC 4.66  3.87 -  5.11 MIL/uL   Hemoglobin 14.7  12.0 - 15.0 g/dL   HCT 13.0  86.5 - 78.4 %   MCV 89.1  78.0 - 100.0 fL   MCH 31.5  26.0 - 34.0 pg   MCHC 35.4  30.0 - 36.0 g/dL   RDW 69.6  29.5 - 28.4 %   Platelets 254  150 - 400 K/uL   Neutrophils Relative % 82 (*) 43 - 77 %   Neutro Abs 5.0  1.7 - 7.7 K/uL   Lymphocytes Relative 11 (*) 12 - 46 %   Lymphs Abs 0.7  0.7 - 4.0 K/uL   Monocytes Relative 7  3 - 12 %   Monocytes Absolute 0.4  0.1 - 1.0 K/uL   Eosinophils Relative 1  0 - 5 %   Eosinophils Absolute 0.0  0.0 - 0.7 K/uL   Basophils Relative 0  0 - 1 %   Basophils Absolute 0.0  0.0 - 0.1 K/uL  COMPREHENSIVE METABOLIC PANEL     Status: Abnormal   Collection Time    03/12/13  6:00 AM      Result Value Range    Sodium 131 (*) 135 - 145 mEq/L   Potassium 3.3 (*) 3.5 - 5.1 mEq/L   Chloride 95 (*) 96 - 112 mEq/L   CO2 24  19 - 32 mEq/L   Glucose, Bld 110 (*) 70 - 99 mg/dL   BUN 7  6 - 23 mg/dL   Creatinine, Ser 1.32  0.50 - 1.10 mg/dL   Calcium 9.0  8.4 - 44.0 mg/dL   Total Protein 7.6  6.0 - 8.3 g/dL   Albumin 3.8  3.5 - 5.2 g/dL   AST 102 (*) 0 - 37 U/L   ALT 459 (*) 0 - 35 U/L   Alkaline Phosphatase 165 (*) 39 - 117 U/L   Total Bilirubin 5.3 (*) 0.3 - 1.2 mg/dL   GFR calc non Af Amer >90  >90 mL/min   GFR calc Af Amer >90  >90 mL/min   Comment: (NOTE)     The eGFR has been calculated using the CKD EPI equation.     This calculation has not been validated in all clinical situations.     eGFR's persistently <90 mL/min signify possible Chronic Kidney     Disease.  LIPASE, BLOOD     Status: Abnormal   Collection Time    03/12/13  6:00 AM      Result Value Range   Lipase 1317 (*) 11 - 59 U/L  URINALYSIS, ROUTINE W REFLEX MICROSCOPIC     Status: Abnormal   Collection Time    03/12/13  7:04 AM      Result Value Range   Color, Urine YELLOW  YELLOW   APPearance CLEAR  CLEAR   Specific Gravity, Urine 1.008  1.005 - 1.030   pH 6.5  5.0 - 8.0   Glucose, UA NEGATIVE  NEGATIVE mg/dL   Hgb urine dipstick NEGATIVE  NEGATIVE   Bilirubin Urine MODERATE (*) NEGATIVE   Ketones, ur 40 (*) NEGATIVE mg/dL   Protein, ur NEGATIVE  NEGATIVE mg/dL   Urobilinogen, UA 0.2  0.0 - 1.0 mg/dL   Nitrite NEGATIVE  NEGATIVE   Leukocytes, UA NEGATIVE  NEGATIVE   Comment: MICROSCOPIC NOT DONE ON URINES WITH NEGATIVE PROTEIN, BLOOD, LEUKOCYTES, NITRITE, OR GLUCOSE <1000 mg/dL.   US Abdomen Complete  03/12/2013   CLINICAL DATA:  Abdominal pain, abnormal LFTs and lipase. Concern for choledocholithiasis or gallstone pancreatitis.  EXAM: ULTRASOUND ABDOMEN COMPLETE  COMPARISON:  CT abdomen / pelvis 01/07/2006  FINDINGS: Gallbladder:  Mobile echogenic shadowing foci within the gallbladder lumen consistent with  cholelithiasis. No gallbladder wall thickening or pericholecystic fluid. Per the sonographer, the sonographic Eulah Pont sign was negative. A small echogenic focus is identified in the neck of the gallbladder, or proximal cystic duct. This is unlikely to be obstructing given the other findings described above.  Common bile duct:  Diameter: Dilated at 13 mm. Common bile duct is well seen to the upper pancreatic head. No choledocholithiasis. The most distal common bile duct is not seen.  Liver:  Mild intrahepatic biliary ductal dilatation. Fairly well defined 2 cm hyperechoic area in the posterior right hepatic lobe corresponds with a hemangioma seen on a prior CT scan. No additional hepatic lesion identified.  IVC:  No abnormality visualized.  Pancreas:  Pancreatic head is a very mildly hypoechoic which is nonspecific but can be seen with edema. No focal mass. Minimally prominent pancreatic duct at 2.4 mm.  Spleen:  Size and appearance within normal limits.  Right Kidney:  Length: 11.9 cm. Echogenicity within normal limits. No mass or hydronephrosis visualized.  Left Kidney:  Length: 12.5 cm. Echogenicity within normal limits. No mass or hydronephrosis visualized.  Abdominal aorta:  No aneurysm visualized.  Other findings:  None.  IMPRESSION: 1. Intra and extrahepatic biliary ductal dilatation in the setting of cholelithiasis is concerning for distal choledocholithiasis versus a distal biliary stricture. A pancreatic or ampullary mass is also a possibility. Recommend further evaluation with MRCP or ERCP. 2. Cholelithiasis without sonographic features of cholecystitis. 3. Mild hypo echogenicity of the pancreatic head as can be seen with edema in the setting of pancreatitis. 4. Small benign hemangioma in the right hepatic lobe.   Electronically Signed   By: Malachy Moan M.D.   On: 03/12/2013 10:36       Assessment/Plan 1. Gallstone pancreatitis 2. Hyperbilirubinemia 3. transaminitis 4. H/o ovarian  cancer  Plan: 1. The patient is already schedule for an MRCP.  If she has a CBD stone, then she will need an ERCP prior to an operation.  Her pancreatitis will also need to resolved prior to surgical intervention as well.  We would recommend npo at this time to allow her pancreas to rest.  She should remain NPO p MN for possible procedures tomorrow as well. 2. Repeat labs in AM 3. Discussed need for cholecystectomy this admission with the patient, which she is agreeable to.  She also understands the indications for timing of her surgery, such as if she were to need and ERCP and allowing her pancreatitis to resolve. 4. We will follow along.  Sai Zinn E 03/12/2013, 1:24 PM Pager: [phone]

## 2013-03-12 NOTE — ED Notes (Signed)
Pt taken to ultasound.

## 2013-03-12 NOTE — ED Notes (Signed)
Returned from [place] and taken to floor.

## 2013-03-12 NOTE — ED Notes (Signed)
Attempted to call report, floor sts to call back in 5 mins

## 2013-03-12 NOTE — ED Provider Notes (Signed)
CSN: [id number]     Arrival date & time 03/12/13  0445 History   First MD Initiated Contact with Patient 03/12/13 0503     Chief Complaint  Patient presents with  . Abdominal Pain   (Consider location/radiation/quality/duration/timing/severity/associated sxs/prior Treatment) The history is provided by the patient.   67 year old female comes in with epigastric and right upper quadrant pain which has been present for approximately 4 months but getting worse. She describes a vague, dull, achy feeling. There is mild nausea but no vomiting or diarrhea. Pain is not affected by EEG and or by having a bowel movement. She denies fever or chills. She has noted her stools were darker than normal. Pain has gotten worse over the last several days and she states that she is just miserable all the time although she only rates her pain at 3/10. She had been diagnosed with gallstones many years ago and had been treated by a naturopath and had been doing well until the past several months. It is not affected by eating or body position. She does state that she's been under a lot of stress.  Past Medical History  Diagnosis Date  . SBO (small bowel obstruction)   . Ovarian cancer    History reviewed. No pertinent past surgical history. History reviewed. No pertinent family history. History  Substance Use Topics  . Smoking status: Never Smoker   . Smokeless tobacco: Not on file  . Alcohol Use: No   OB History   Grav Para Term Preterm Abortions TAB SAB Ect Mult Living                 Review of Systems  All other systems reviewed and are negative.    Allergies  Review of patient's allergies indicates no known allergies.  Home Medications  No current outpatient prescriptions on file. BP 168/93  Pulse 75  Temp(Src) 98.1 F (36.7 C) (Oral)  Resp 14  Ht 5\' 6"  (1.676 m)  Wt 135 lb (61.236 kg)  BMI 21.80 kg/m2  SpO2 100% Physical Exam  Nursing note and vitals reviewed.  67 year old female,  resting comfortably and in no acute distress. Vital signs are significant for hypertension with blood pressure 160/93. Oxygen saturation is 100%, which is normal. Head is normocephalic and atraumatic. PERRLA, EOMI. Oropharynx is clear. Conjunctiva are pink. Neck is nontender and supple without adenopathy or JVD. Back is nontender and there is no CVA tenderness. Lungs are clear without rales, wheezes, or rhonchi. Chest is nontender. Heart has regular rate and rhythm without murmur. Abdomen is soft, flat, with mild epigastric and right upper quadrant tenderness without rebound or guarding. There are no masses or hepatosplenomegaly and peristalsis is normoactive. Rectal: Normal sphincter tone, no masses, soft brown stool which is Hemoccult negative. Extremities have no cyanosis or edema, full range of motion is present. Skin is warm and dry without rash. Neurologic: Mental status is normal, cranial nerves are intact, there are no motor or sensory deficits.  ED Course  Procedures (including critical care time) Labs Review Results for orders placed during the hospital encounter of 03/12/13  CBC WITH DIFFERENTIAL      Result Value Range   WBC 6.1  4.0 - 10.5 K/uL   RBC 4.66  3.87 - 5.11 MIL/uL   Hemoglobin 14.7  12.0 - 15.0 g/dL   HCT 40.9  81.1 - 91.4 %   MCV 89.1  78.0 - 100.0 fL   MCH 31.5  26.0 - 34.0 pg  MCHC 35.4  30.0 - 36.0 g/dL   RDW 40.9  81.1 - 91.4 %   Platelets 254  150 - 400 K/uL   Neutrophils Relative % 82 (*) 43 - 77 %   Neutro Abs 5.0  1.7 - 7.7 K/uL   Lymphocytes Relative 11 (*) 12 - 46 %   Lymphs Abs 0.7  0.7 - 4.0 K/uL   Monocytes Relative 7  3 - 12 %   Monocytes Absolute 0.4  0.1 - 1.0 K/uL   Eosinophils Relative 1  0 - 5 %   Eosinophils Absolute 0.0  0.0 - 0.7 K/uL   Basophils Relative 0  0 - 1 %   Basophils Absolute 0.0  0.0 - 0.1 K/uL  COMPREHENSIVE METABOLIC PANEL      Result Value Range   Sodium 131 (*) 135 - 145 mEq/L   Potassium 3.3 (*) 3.5 - 5.1 mEq/L    Chloride 95 (*) 96 - 112 mEq/L   CO2 24  19 - 32 mEq/L   Glucose, Bld 110 (*) 70 - 99 mg/dL   BUN 7  6 - 23 mg/dL   Creatinine, Ser 7.82  0.50 - 1.10 mg/dL   Calcium 9.0  8.4 - 95.6 mg/dL   Total Protein 7.6  6.0 - 8.3 g/dL   Albumin 3.8  3.5 - 5.2 g/dL   AST 213 (*) 0 - 37 U/L   ALT 459 (*) 0 - 35 U/L   Alkaline Phosphatase 165 (*) 39 - 117 U/L   Total Bilirubin 5.3 (*) 0.3 - 1.2 mg/dL   GFR calc non Af Amer >90  >90 mL/min   GFR calc Af Amer >90  >90 mL/min  LIPASE, BLOOD      Result Value Range   Lipase 1317 (*) 11 - 59 U/L  OCCULT BLOOD, POC DEVICE      Result Value Range   Fecal Occult Bld NEGATIVE  NEGATIVE   MDM   1. Pancreatitis   2. Jaundice   3. Elevated liver enzymes   4. Hypokalemia   5. Hyponatremia    Abdominal pain of uncertain cause. Lack of worsening with eating is suggestive that it is not biliary in nature. Suspect possible peptic ulcer disease. Old records are reviewed and she did have an ultrasound in 2005 which showed gallstones which was confirmed by a CT scan 2007. With known gallstones and a history and exam not suggestive of acute cholecystitis, do not see an indication for repeat ultrasound.  Subjective relief of pain with just ondansetron and pantoprazole. However, laboratory workup came back significant for pancreatitis with lipase of over 1300 and jaundice with bilirubin over 5. Moderate elevation of transaminases was present with only mild elevation of alkaline phosphatase. She is noted to have mild hyponatremia and hypokalemia and is given supplemental IV potassium. Ultrasound has been ordered to rule out common bile duct stone. She'll need to be admitted. Case is discussed with Dr. Gwenlyn Perking of [facility] who agrees to admit the patient. Of note, patient does have a gastroenterologist who is Dr.Ganem.   Dione Booze, MD 03/12/13 [phone]

## 2013-03-12 NOTE — ED Notes (Signed)
Patient with epigastric and lower abdomen pain.  Patient denies any nausea or vomiting, no diarrhea.  She does state that she has an uneasy feeling in her abdomen.  Patient sees a naturopathic MD, has been using essential oils which did relieve some of the pain, but has now gotten worse.  Patient states she has been able to drink liquids, but her mouth is dry.

## 2013-03-13 ENCOUNTER — Inpatient Hospital Stay (HOSPITAL_COMMUNITY): Payer: Medicare Other

## 2013-03-13 DIAGNOSIS — R748 Abnormal levels of other serum enzymes: Secondary | ICD-10-CM

## 2013-03-13 DIAGNOSIS — R17 Unspecified jaundice: Secondary | ICD-10-CM

## 2013-03-13 LAB — COMPREHENSIVE METABOLIC PANEL
AST: 183 U/L — ABNORMAL HIGH (ref 0–37)
Albumin: 3.2 g/dL — ABNORMAL LOW (ref 3.5–5.2)
BUN: 6 mg/dL (ref 6–23)
Calcium: 8.3 mg/dL — ABNORMAL LOW (ref 8.4–10.5)
Chloride: 105 mEq/L (ref 96–112)
Creatinine, Ser: 0.57 mg/dL (ref 0.50–1.10)
GFR calc non Af Amer: >90 mL/min (ref >90)
Sodium: 139 mEq/L (ref 135–145)
Total Protein: 6.4 g/dL (ref 6.0–8.3)

## 2013-03-13 LAB — CBC
HCT: 37.5 % (ref 36.0–46.0)
MCV: 91 fL (ref 78.0–100.0)
Platelets: 219 10*3/uL (ref 150–400)
RBC: 4.12 MIL/uL (ref 3.87–5.11)
WBC: 5.1 10*3/uL (ref 4.0–10.5)

## 2013-03-13 MED ORDER — SODIUM CHLORIDE 0.9 % IV SOLN
1.5000 g | Freq: Four times a day (QID) | INTRAVENOUS | Status: DC
  Administered 2013-03-13 – 2013-03-16 (×13): 1.5 g via INTRAVENOUS
  Filled 2013-03-13 (×19): qty 1.5

## 2013-03-13 MED ORDER — GADOBENATE DIMEGLUMINE 529 MG/ML IV SOLN
13.0000 mL | Freq: Once | INTRAVENOUS | Status: AC | PRN
  Administered 2013-03-13: 13 mL via INTRAVENOUS

## 2013-03-13 NOTE — Progress Notes (Signed)
[facility]                                                                                Patient Demographics  Zoe Jackson, is a 67 y.o. female, DOB - 09-24-1945, WUJ:[id number]  Admit date - 03/12/2013   Admitting Physician No admitting provider for patient encounter.  Outpatient Primary MD for the patient is Zoe Bradford, MD  LOS - 1   Chief Complaint  Patient presents with  . Abdominal Pain        Assessment & Plan  Principal Problem:   Pancreatitis, acute Active Problems:   Pancreatitis   Hypokalemia   Hyponatremia   Transaminitis   Cholelithiasis   GERD (gastroesophageal reflux disease)  Intractable abdominal pain and nausea likely secondary to cholelithiasis with possible gallstone pancreatitis -LFTs, Lipase, total bilirubin elevated -GI and surgery consulted -Patient had her MRCP today -Possible ERCP versus cholecystectomy to be determined -Will continue pain control, IVF, Unasyn  Hypokalemia -K 3.6, will continue to monitor -Likely secondary dehydration and poor intake  Hyponatremia -Likely secondary dehydration and poor intake -Improving, Na 139, will continue to monitor -Continue IVF  Transaminitis -Will continue to monitor -Concern for common bile duct stone -Plan as above   GERD -Continue Protonix   Code Status: Full  Family Communication: None at this time  Disposition Plan: Admitted, expected length of stay additional 2-3 midnights  Procedures  MRCP  Consults   General surgery Gastroenterology, Dr. Matthias Hughs  DVT Prophylaxis  Heparin   Lab Results  Component Value Date   PLT 219 03/13/2013    Medications  Scheduled Meds: . ampicillin-sulbactam (UNASYN) IV  1.5 g Intravenous Q6H  . heparin  5,000 Units Subcutaneous Q8H  . pantoprazole (PROTONIX) IV  40 mg Intravenous Q12H   Continuous Infusions: . sodium chloride 200 mL/hr at 03/12/13 2132   PRN Meds:.acetaminophen, acetaminophen, morphine injection,  ondansetron (ZOFRAN) IV, ondansetron  Antibiotics    Anti-infectives   Start     Dose/Rate Route Frequency Ordered Stop   03/13/13 1200  ampicillin-sulbactam (UNASYN) 1.5 g in sodium chloride 0.9 % 50 mL IVPB     1.5 g 100 mL/hr over 30 Minutes Intravenous Every 6 hours 03/13/13 1122         Time Spent in minutes   30 minutes   Zoe Jackson D.O. on 03/13/2013 at 11:43 AM  Between 7am to 7pm - Pager - [phone]  After 7pm go to [email or web address] - password [email or web address]  And look for the night coverage person covering for me after hours  [facility] Office  [phone]    Subjective:   Zoe Jackson seen and examined today.  Patient states abdominal pain has improved.   Patient denies dizziness, chest pain, shortness of breath, new weakness, numbess, tingling.    Objective:   Filed Vitals:   03/12/13 2110 03/13/13 0229 03/13/13 0538 03/13/13 1000  BP: 134/63 126/70 148/76 152/78  Pulse: 75 70 70 89  Temp: 98.8 F (37.1 C) 98.6 F (37 C) 98.4 F (36.9 C) 97.9 F (36.6 C)  TempSrc: Oral Oral Oral Oral  Resp: 16 16 16 18   Height:      Weight:  SpO2: 98% 98% 98% 99%    Wt Readings from Last 3 Encounters:  03/12/13 61.236 kg (135 lb)    No intake or output data in the 24 hours ending 03/13/13 1143  Exam  General: Well developed, well nourished, NAD, appears stated age  HEENT: NCAT, PERRLA, EOMI, Mildly icteic Sclera, mucous membranes moist. No pharyngeal erythema or exudates  Neck: Supple, no JVD, no masses  Cardiovascular: S1 S2 auscultated, no rubs, murmurs or gallops. Regular rate and rhythm.  Respiratory: Clear to auscultation bilaterally with equal chest rise  Abdomen: Soft, tender to RUQ palpation, nondistended, + bowel sounds  Extremities: warm dry without cyanosis clubbing or edema  Neuro: AAOx3, cranial nerves grossly intact. Strength 5/5 in patient's upper and lower extremities bilaterally  Skin: Without rashes exudates or  nodules  Psych: Normal affect and demeanor with intact judgement and insight    Data Review   Micro Results No results found for this or any previous visit (from the past 240 hour(s)).  Radiology Reports US Abdomen Complete  03/12/2013   CLINICAL DATA:  Abdominal pain, abnormal LFTs and lipase. Concern for choledocholithiasis or gallstone pancreatitis.  EXAM: ULTRASOUND ABDOMEN COMPLETE  COMPARISON:  CT abdomen / pelvis 01/07/2006  FINDINGS: Gallbladder:  Mobile echogenic shadowing foci within the gallbladder lumen consistent with cholelithiasis. No gallbladder wall thickening or pericholecystic fluid. Per the sonographer, the sonographic Zoe Jackson sign was negative. A small echogenic focus is identified in the neck of the gallbladder, or proximal cystic duct. This is unlikely to be obstructing given the other findings described above.  Common bile duct:  Diameter: Dilated at 13 mm. Common bile duct is well seen to the upper pancreatic head. No choledocholithiasis. The most distal common bile duct is not seen.  Liver:  Mild intrahepatic biliary ductal dilatation. Fairly well defined 2 cm hyperechoic area in the posterior right hepatic lobe corresponds with a hemangioma seen on a prior CT scan. No additional hepatic lesion identified.  IVC:  No abnormality visualized.  Pancreas:  Pancreatic head is a very mildly hypoechoic which is nonspecific but can be seen with edema. No focal mass. Minimally prominent pancreatic duct at 2.4 mm.  Spleen:  Size and appearance within normal limits.  Right Kidney:  Length: 11.9 cm. Echogenicity within normal limits. No mass or hydronephrosis visualized.  Left Kidney:  Length: 12.5 cm. Echogenicity within normal limits. No mass or hydronephrosis visualized.  Abdominal aorta:  No aneurysm visualized.  Other findings:  None.  IMPRESSION: 1. Intra and extrahepatic biliary ductal dilatation in the setting of cholelithiasis is concerning for distal choledocholithiasis versus a  distal biliary stricture. A pancreatic or ampullary mass is also a possibility. Recommend further evaluation with MRCP or ERCP. 2. Cholelithiasis without sonographic features of cholecystitis. 3. Mild hypo echogenicity of the pancreatic head as can be seen with edema in the setting of pancreatitis. 4. Small benign hemangioma in the right hepatic lobe.   Electronically Signed   By: Malachy Moan M.D.   On: 03/12/2013 10:36   Mr 3d Recon At Scanner  03/13/2013   CLINICAL DATA:  Right upper quadrant abdominal pain. Chololithiasis.  EXAM: MRI ABDOMEN WITHOUT AND WITH CONTRAST (INCLUDING MRCP)  TECHNIQUE: Multiplanar multisequence MR imaging of the abdomen was performed both before and after the administration of intravenous contrast. Heavily T2-weighted images of the biliary and pancreatic ducts were obtained, and three-dimensional MRCP images were rendered by post processing.  CONTRAST:  13mL MULTIHANCE GADOBENATE DIMEGLUMINE 529 MG/ML IV SOLN  COMPARISON:  Ultrasound 03/12/2013.  CT scan 01/07/2006.  FINDINGS: The liver demonstrates a stable hemangioma in the right hepatic lobe with classic MR findings and no change since CT scan of 2007. No other significant hepatic lesions. There is moderate intrahepatic biliary dilatation and moderate common bile duct dilatation. The common bile duct measures a maximum of 12 mm and head of the pancreas. This is a stable finding since 2007 and may be due to a distal stricture. No common bile duct stones are identified. The cystic duct is also slightly dilated and tortuous with a low insertion. Numerous gallstones are noted but no obvious cystic duct stones.  The pancreas and main pancreatic duct are normal. The spleen is normal. The adrenal glands and kidneys are normal.  The aorta is normal in caliber. The major branch vessels are normal. No mesenteric or retroperitoneal mass or adenopathy.  IMPRESSION: 1. 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 No common bile duct stones are identified. 2. Chololithiasis. 3. Stable right hepatic lobe hemangioma.   Electronically Signed   By: Loralie Champagne M.D.   On: 03/13/2013 11:12   Mr Abd W/wo Cm/mrcp  03/13/2013   CLINICAL DATA:  Right upper quadrant abdominal pain. Chololithiasis.  EXAM: MRI ABDOMEN WITHOUT AND WITH CONTRAST (INCLUDING MRCP)  TECHNIQUE: Multiplanar multisequence MR imaging of the abdomen was performed both before and after the administration of intravenous contrast. Heavily T2-weighted images of the biliary and pancreatic ducts were obtained, and three-dimensional MRCP images were rendered by post processing.  CONTRAST:  13mL MULTIHANCE GADOBENATE DIMEGLUMINE 529 MG/ML IV SOLN  COMPARISON:  Ultrasound 03/12/2013.  CT scan 01/07/2006.  FINDINGS: The liver demonstrates a stable hemangioma in the right hepatic lobe with classic MR findings and no change since CT scan of 2007. No other significant hepatic lesions. There is moderate intrahepatic biliary dilatation and moderate common bile duct dilatation. The common bile duct measures a maximum of 12 mm and head of the pancreas. This is a stable finding since 2007 and may be due to a distal stricture. No common bile duct stones are identified. The cystic duct is also slightly dilated and tortuous with a low insertion. Numerous gallstones are noted but no obvious cystic duct stones.  The pancreas and main pancreatic duct are normal. The spleen is normal. The adrenal glands and kidneys are normal.  The aorta is normal in caliber. The major branch vessels are normal. No mesenteric or retroperitoneal mass or adenopathy.  IMPRESSION: 1. 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 No common bile duct stones are identified. 2. Chololithiasis. 3. Stable right hepatic lobe hemangioma.   Electronically Signed   By: Loralie Champagne M.D.   On: 03/13/2013 11:12     CBC  Recent Labs Lab 03/12/13 0600 03/13/13 0400  WBC 6.1 5.1  HGB 14.7 12.7  HCT 41.5 37.5  PLT 254 219  MCV 89.1 91.0  MCH 31.5 30.8  MCHC 35.4 33.9  RDW 13.0 13.3  LYMPHSABS 0.7  --   MONOABS 0.4  --   EOSABS 0.0  --   BASOSABS 0.0  --     Chemistries   Recent Labs Lab 03/12/13 0600 03/12/13 1248 03/13/13 0400  NA 131*  --  139  K 3.3*  --  3.6  CL 95*  --  105  CO2 24  --  20  GLUCOSE 110*  --  77  BUN 7  --  6  CREATININE 0.55  --  0.57  CALCIUM 9.0  --  8.3*  MG  --  2.0  --   AST 331*  --  183*  ALT 459*  --  318*  ALKPHOS 165*  --  151*  BILITOT 5.3* 5.2* 6.0*   ------------------------------------------------------------------------------------------------------------------ estimated creatinine clearance is 64.8 ml/min (by C-G formula based on Cr of 0.57). ------------------------------------------------------------------------------------------------------------------ No results found for this basename: HGBA1C,  in the last 72 hours ------------------------------------------------------------------------------------------------------------------  Recent Labs  03/12/13 1248  CHOL 195  HDL 82  LDLCALC 103*  TRIG 52  CHOLHDL 2.4   ------------------------------------------------------------------------------------------------------------------  Recent Labs  03/12/13 1248  TSH 2.720   ------------------------------------------------------------------------------------------------------------------ No results found for this basename: VITAMINB12, FOLATE, FERRITIN, TIBC, IRON, RETICCTPCT,  in the last 72 hours  Coagulation profile No results found for this basename: INR, PROTIME,  in the last 168 hours  No results found for this basename: DDIMER,  in the last 72 hours  Cardiac Enzymes No results found for this basename: CK, CKMB, TROPONINI, MYOGLOBIN,  in the last 168  hours ------------------------------------------------------------------------------------------------------------------ No components found with this basename: POCBNP,

## 2013-03-13 NOTE — Progress Notes (Signed)
Subjective: Feeling better.  No abdominal pain.  Feels hungry  Objective: Vital signs in last 24 hours: Temp:  [97.9 F (36.6 C)-99.5 F (37.5 C)] 97.9 F (36.6 C) (12/06 1000) Pulse Rate:  [70-89] 89 (12/06 1000) Resp:  [16-18] 18 (12/06 1000) BP: (126-152)/(63-78) 152/78 mmHg (12/06 1000) SpO2:  [97 %-99 %] 99 % (12/06 1000)    Intake/Output from previous day:   Intake/Output this shift:    General appearance: alert, cooperative and no distress GI: soft, mild epigastric tenderness, ND, no peritoneal signs  Lab Results:   Recent Labs  03/12/13 0600 03/13/13 0400  WBC 6.1 5.1  HGB 14.7 12.7  HCT 41.5 37.5  PLT 254 219   BMET  Recent Labs  03/12/13 0600 03/13/13 0400  NA 131* 139  K 3.3* 3.6  CL 95* 105  CO2 24 20  GLUCOSE 110* 77  BUN 7 6  CREATININE 0.55 0.57  CALCIUM 9.0 8.3*   PT/INR No results found for this basename: LABPROT, INR,  in the last 72 hours ABG No results found for this basename: PHART, PCO2, PO2, HCO3,  in the last 72 hours  Studies/Results: US Abdomen Complete  03/12/2013   CLINICAL DATA:  Abdominal pain, abnormal LFTs and lipase. Concern for choledocholithiasis or gallstone pancreatitis.  EXAM: ULTRASOUND ABDOMEN COMPLETE  COMPARISON:  CT abdomen / pelvis 01/07/2006  FINDINGS: Gallbladder:  Mobile echogenic shadowing foci within the gallbladder lumen consistent with cholelithiasis. No gallbladder wall thickening or pericholecystic fluid. Per the sonographer, the sonographic Eulah Pont sign was negative. A small echogenic focus is identified in the neck of the gallbladder, or proximal cystic duct. This is unlikely to be obstructing given the other findings described above.  Common bile duct:  Diameter: Dilated at 13 mm. Common bile duct is well seen to the upper pancreatic head. No choledocholithiasis. The most distal common bile duct is not seen.  Liver:  Mild intrahepatic biliary ductal dilatation. Fairly well defined 2 cm hyperechoic area  in the posterior right hepatic lobe corresponds with a hemangioma seen on a prior CT scan. No additional hepatic lesion identified.  IVC:  No abnormality visualized.  Pancreas:  Pancreatic head is a very mildly hypoechoic which is nonspecific but can be seen with edema. No focal mass. Minimally prominent pancreatic duct at 2.4 mm.  Spleen:  Size and appearance within normal limits.  Right Kidney:  Length: 11.9 cm. Echogenicity within normal limits. No mass or hydronephrosis visualized.  Left Kidney:  Length: 12.5 cm. Echogenicity within normal limits. No mass or hydronephrosis visualized.  Abdominal aorta:  No aneurysm visualized.  Other findings:  None.  IMPRESSION: 1. Intra and extrahepatic biliary ductal dilatation in the setting of cholelithiasis is concerning for distal choledocholithiasis versus a distal biliary stricture. A pancreatic or ampullary mass is also a possibility. Recommend further evaluation with MRCP or ERCP. 2. Cholelithiasis without sonographic features of cholecystitis. 3. Mild hypo echogenicity of the pancreatic head as can be seen with edema in the setting of pancreatitis. 4. Small benign hemangioma in the right hepatic lobe.   Electronically Signed   By: Malachy Moan M.D.   On: 03/12/2013 10:36   Mr 3d Recon At Scanner  03/13/2013   CLINICAL DATA:  Right upper quadrant abdominal pain. Chololithiasis.  EXAM: MRI ABDOMEN WITHOUT AND WITH CONTRAST (INCLUDING MRCP)  TECHNIQUE: Multiplanar multisequence MR imaging of the abdomen was performed both before and after the administration of intravenous contrast. Heavily T2-weighted images of the biliary and pancreatic ducts  were obtained, and three-dimensional MRCP images were rendered by post processing.  CONTRAST:  13mL MULTIHANCE GADOBENATE DIMEGLUMINE 529 MG/ML IV SOLN  COMPARISON:  Ultrasound 03/12/2013.  CT scan 01/07/2006.  FINDINGS: The liver demonstrates a stable hemangioma in the right hepatic lobe with classic MR findings and no  change since CT scan of 2007. No other significant hepatic lesions. There is moderate intrahepatic biliary dilatation and moderate common bile duct dilatation. The common bile duct measures a maximum of 12 mm and head of the pancreas. This is a stable finding since 2007 and may be due to a distal stricture. No common bile duct stones are identified. The cystic duct is also slightly dilated and tortuous with a low insertion. Numerous gallstones are noted but no obvious cystic duct stones.  The pancreas and main pancreatic duct are normal. The spleen is normal. The adrenal glands and kidneys are normal.  The aorta is normal in caliber. The major branch vessels are normal. No mesenteric or retroperitoneal mass or adenopathy.  IMPRESSION: 1. 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 No common bile duct stones are identified. 2. Chololithiasis. 3. Stable right hepatic lobe hemangioma.   Electronically Signed   By: Loralie Champagne M.D.   On: 03/13/2013 11:12   Mr Abd W/wo Cm/mrcp  03/13/2013   CLINICAL DATA:  Right upper quadrant abdominal pain. Chololithiasis.  EXAM: MRI ABDOMEN WITHOUT AND WITH CONTRAST (INCLUDING MRCP)  TECHNIQUE: Multiplanar multisequence MR imaging of the abdomen was performed both before and after the administration of intravenous contrast. Heavily T2-weighted images of the biliary and pancreatic ducts were obtained, and three-dimensional MRCP images were rendered by post processing.  CONTRAST:  13mL MULTIHANCE GADOBENATE DIMEGLUMINE 529 MG/ML IV SOLN  COMPARISON:  Ultrasound 03/12/2013.  CT scan 01/07/2006.  FINDINGS: The liver demonstrates a stable hemangioma in the right hepatic lobe with classic MR findings and no change since CT scan of 2007. No other significant hepatic lesions. There is moderate intrahepatic biliary dilatation and moderate common bile duct dilatation. The common bile duct measures a maximum of 12 mm and head  of the pancreas. This is a stable finding since 2007 and may be due to a distal stricture. No common bile duct stones are identified. The cystic duct is also slightly dilated and tortuous with a low insertion. Numerous gallstones are noted but no obvious cystic duct stones.  The pancreas and main pancreatic duct are normal. The spleen is normal. The adrenal glands and kidneys are normal.  The aorta is normal in caliber. The major branch vessels are normal. No mesenteric or retroperitoneal mass or adenopathy.  IMPRESSION: 1. 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 No common bile duct stones are identified. 2. Chololithiasis. 3. Stable right hepatic lobe hemangioma.   Electronically Signed   By: Loralie Champagne M.D.   On: 03/13/2013 11:12    Anti-infectives: Anti-infectives   Start     Dose/Rate Route Frequency Ordered Stop   03/13/13 1200  ampicillin-sulbactam (UNASYN) 1.5 g in sodium chloride 0.9 % 50 mL IVPB     1.5 g 100 mL/hr over 30 Minutes Intravenous Every 6 hours 03/13/13 1122        Assessment/Plan: s/p * No surgery found * I have discussed her case with the patient and with Dr. Madilyn Fireman and we have decided to wait for tomorrows labs to decide whether to proceed with ERCP or surgery first.  She  is clinically improving although her TB is increased.  If her TB is increasing, then we will plan for ERCP prior to cholecystectomy, but if lipase and LFT's normalize, will plan for cholecystectomy and IOC with possible ERCP depending on IOC.  She was in agreement with this plan.  Keep NPO and start Unasyn for cholangitis prophylaxis  LOS: 1 day    Lodema Pilot DAVID 03/13/2013

## 2013-03-13 NOTE — Progress Notes (Addendum)
[facility] Progress Note  Subjective: Patient currently without any significant abdominal pain nausea or other symptoms  Objective: Vital signs in last 24 hours: Temp:  [98.2 F (36.8 C)-99.5 F (37.5 C)] 98.4 F (36.9 C) (12/06 0538) Pulse Rate:  [67-75] 70 (12/06 0538) Resp:  [16-18] 16 (12/06 0538) BP: (126-148)/(59-76) 148/76 mmHg (12/06 0538) SpO2:  [97 %-99 %] 98 % (12/06 0538) Weight:  [61.236 kg (135 lb)] 61.236 kg (135 lb) (12/05 1037) Weight change: 0 kg (0 lb)   PE: Abdomen soft essentially nontender with slightly hypoactive bowel sounds. No hepatomegaly masses or guarding  Lab Results: Results for orders placed during the hospital encounter of 03/12/13 (from the past 24 hour(s))  PHOSPHORUS     Status: None   Collection Time    03/12/13 12:48 PM      Result Value Range   Phosphorus 2.6  2.3 - 4.6 mg/dL  MAGNESIUM     Status: None   Collection Time    03/12/13 12:48 PM      Result Value Range   Magnesium 2.0  1.5 - 2.5 mg/dL  TSH     Status: None   Collection Time    03/12/13 12:48 PM      Result Value Range   TSH 2.720  0.350 - 4.500 uIU/mL  BILIRUBIN, FRACTIONATED(TOT/DIR/INDIR)     Status: Abnormal   Collection Time    03/12/13 12:48 PM      Result Value Range   Total Bilirubin 5.2 (*) 0.3 - 1.2 mg/dL   Bilirubin, Direct 4.1 (*) 0.0 - 0.3 mg/dL   Indirect Bilirubin 1.1 (*) 0.3 - 0.9 mg/dL  LIPID PANEL     Status: Abnormal   Collection Time    03/12/13 12:48 PM      Result Value Range   Cholesterol 195  0 - 200 mg/dL   Triglycerides 52  <161 mg/dL   HDL 82  >09 mg/dL   Total CHOL/HDL Ratio 2.4     VLDL 10  0 - 40 mg/dL   LDL Cholesterol 604 (*) 0 - 99 mg/dL  COMPREHENSIVE METABOLIC PANEL     Status: Abnormal   Collection Time    03/13/13  4:00 AM      Result Value Range   Sodium 139  135 - 145 mEq/L   Potassium 3.6  3.5 - 5.1 mEq/L   Chloride 105  96 - 112 mEq/L   CO2 20  19 - 32 mEq/L   Glucose, Bld 77  70 - 99 mg/dL   BUN 6  6  - 23 mg/dL   Creatinine, Ser 5.40  0.50 - 1.10 mg/dL   Calcium 8.3 (*) 8.4 - 10.5 mg/dL   Total Protein 6.4  6.0 - 8.3 g/dL   Albumin 3.2 (*) 3.5 - 5.2 g/dL   AST 981 (*) 0 - 37 U/L   ALT 318 (*) 0 - 35 U/L   Alkaline Phosphatase 151 (*) 39 - 117 U/L   Total Bilirubin 6.0 (*) 0.3 - 1.2 mg/dL   GFR calc non Af Amer >90  >90 mL/min   GFR calc Af Amer >90  >90 mL/min  CBC     Status: None   Collection Time    03/13/13  4:00 AM      Result Value Range   WBC 5.1  4.0 - 10.5 K/uL   RBC 4.12  3.87 - 5.11 MIL/uL   Hemoglobin 12.7  12.0 - 15.0 g/dL   HCT 37.5  36.0 - 46.0 %   MCV 91.0  78.0 - 100.0 fL   MCH 30.8  26.0 - 34.0 pg   MCHC 33.9  30.0 - 36.0 g/dL   RDW 81.1  91.4 - 78.2 %   Platelets 219  150 - 400 K/uL  LIPASE, BLOOD     Status: Abnormal   Collection Time    03/13/13  4:00 AM      Result Value Range   Lipase 1152 (*) 11 - 59 U/L    Studies/Results: US Abdomen Complete  03/12/2013   CLINICAL DATA:  Abdominal pain, abnormal LFTs and lipase. Concern for choledocholithiasis or gallstone pancreatitis.  EXAM: ULTRASOUND ABDOMEN COMPLETE  COMPARISON:  CT abdomen / pelvis 01/07/2006  FINDINGS: Gallbladder:  Mobile echogenic shadowing foci within the gallbladder lumen consistent with cholelithiasis. No gallbladder wall thickening or pericholecystic fluid. Per the sonographer, the sonographic Eulah Pont sign was negative. A small echogenic focus is identified in the neck of the gallbladder, or proximal cystic duct. This is unlikely to be obstructing given the other findings described above.  Common bile duct:  Diameter: Dilated at 13 mm. Common bile duct is well seen to the upper pancreatic head. No choledocholithiasis. The most distal common bile duct is not seen.  Liver:  Mild intrahepatic biliary ductal dilatation. Fairly well defined 2 cm hyperechoic area in the posterior right hepatic lobe corresponds with a hemangioma seen on a prior CT scan. No additional hepatic lesion identified.  IVC:   No abnormality visualized.  Pancreas:  Pancreatic head is a very mildly hypoechoic which is nonspecific but can be seen with edema. No focal mass. Minimally prominent pancreatic duct at 2.4 mm.  Spleen:  Size and appearance within normal limits.  Right Kidney:  Length: 11.9 cm. Echogenicity within normal limits. No mass or hydronephrosis visualized.  Left Kidney:  Length: 12.5 cm. Echogenicity within normal limits. No mass or hydronephrosis visualized.  Abdominal aorta:  No aneurysm visualized.  Other findings:  None.  IMPRESSION: 1. Intra and extrahepatic biliary ductal dilatation in the setting of cholelithiasis is concerning for distal choledocholithiasis versus a distal biliary stricture. A pancreatic or ampullary mass is also a possibility. Recommend further evaluation with MRCP or ERCP. 2. Cholelithiasis without sonographic features of cholecystitis. 3. Mild hypo echogenicity of the pancreatic head as can be seen with edema in the setting of pancreatitis. 4. Small benign hemangioma in the right hepatic lobe.   Electronically Signed   By: Malachy Moan M.D.   On: 03/12/2013 10:36      Assessment: 1. Cholelithiasis with dilated common bile duct and elevated liver function tests clinically suspicious for common bile duct stone. However no definite stone stricture or other obstructing lesion seen on ultrasound or MRCP(official report pending but I reviewed with radiologist) 2. Gallstone pancreatitis presumably from either passed or retained stone.  Plan: 1. Borderline call regarding whether to pursue preop ERCP or proceed directly to cholecystectomy with intraoperative cholangiogram. Serologic and radiologic suspicion of common bile duct stone in direct but no definite stones seen and patient is not clinically appear to have signs of cholangitis. Long discussion with patient about 2 strategies and she is very well informed and verbalized that she is leaning toward having cholecystectomy in hopes of  avoiding ERCP after explaining the risks to her. However she is agreeable to whatever GI and surgery decides. Will await surgeriy's input today and would be available and ERCP tomorrow or possibly later today if that is decided upon. 2.  Would also consider starting antibiotics and will definitely need if ERCP opted for-Unasyn prescribed    Matylda Fehring C [phone] 03/13/2013, 7:46 AM

## 2013-03-14 ENCOUNTER — Inpatient Hospital Stay (HOSPITAL_COMMUNITY): Payer: Medicare Other

## 2013-03-14 ENCOUNTER — Encounter (HOSPITAL_COMMUNITY)
Admission: EM | Disposition: A | Discharge status: Home / Self Care | Payer: Self-pay | Source: Home / Self Care | Attending: Internal Medicine

## 2013-03-14 ENCOUNTER — Encounter (HOSPITAL_COMMUNITY): Payer: Self-pay

## 2013-03-14 HISTORY — PX: ERCP: SHX5425

## 2013-03-14 LAB — CBC
Hemoglobin: 13.1 g/dL (ref 12.0–15.0)
MCH: 31.2 pg (ref 26.0–34.0)
MCV: 89.5 fL (ref 78.0–100.0)
RBC: 4.2 MIL/uL (ref 3.87–5.11)
RDW: 13.3 % (ref 11.5–15.5)
WBC: 5.3 10*3/uL (ref 4.0–10.5)

## 2013-03-14 LAB — COMPREHENSIVE METABOLIC PANEL
ALT: 293 U/L — ABNORMAL HIGH (ref 0–35)
Albumin: 3.2 g/dL — ABNORMAL LOW (ref 3.5–5.2)
Alkaline Phosphatase: 186 U/L — ABNORMAL HIGH (ref 39–117)
CO2: 16 mEq/L — ABNORMAL LOW (ref 19–32)
Calcium: 8.4 mg/dL (ref 8.4–10.5)
Chloride: 102 mEq/L (ref 96–112)
GFR calc Af Amer: >90 mL/min (ref >90)
GFR calc non Af Amer: >90 mL/min (ref >90)
Glucose, Bld: 72 mg/dL (ref 70–99)
Potassium: 3.3 mEq/L — ABNORMAL LOW (ref 3.5–5.1)
Sodium: 137 mEq/L (ref 135–145)
Total Bilirubin: 7.4 mg/dL — ABNORMAL HIGH (ref 0.3–1.2)

## 2013-03-14 LAB — NO BLOOD PRODUCTS

## 2013-03-14 SURGERY — ERCP, WITH INTERVENTION IF INDICATED
Anesthesia: Moderate Sedation

## 2013-03-14 MED ORDER — GLUCAGON HCL (RDNA) 1 MG IJ SOLR
INTRAMUSCULAR | Status: AC
  Filled 2013-03-14: qty 3

## 2013-03-14 MED ORDER — FENTANYL CITRATE 0.05 MG/ML IJ SOLN
INTRAMUSCULAR | Status: DC | PRN
  Administered 2013-03-14 (×4): 25 ug via INTRAVENOUS

## 2013-03-14 MED ORDER — MIDAZOLAM HCL 10 MG/2ML IJ SOLN
INTRAMUSCULAR | Status: DC | PRN
  Administered 2013-03-14 (×5): 2 mg via INTRAVENOUS

## 2013-03-14 MED ORDER — POTASSIUM CHLORIDE 10 MEQ/100ML IV SOLN
10.0000 meq | INTRAVENOUS | Status: AC
  Administered 2013-03-14 (×3): 10 meq via INTRAVENOUS
  Filled 2013-03-14: qty 100

## 2013-03-14 MED ORDER — MIDAZOLAM HCL 5 MG/ML IJ SOLN
INTRAMUSCULAR | Status: AC
  Filled 2013-03-14: qty 3

## 2013-03-14 MED ORDER — SODIUM CHLORIDE 0.9 % IV SOLN
INTRAVENOUS | Status: DC | PRN
  Administered 2013-03-14: 15:00:00

## 2013-03-14 MED ORDER — BUTAMBEN-TETRACAINE-BENZOCAINE 2-2-14 % EX AERO
INHALATION_SPRAY | CUTANEOUS | Status: DC | PRN
  Administered 2013-03-14: 2 via TOPICAL

## 2013-03-14 MED ORDER — DIPHENHYDRAMINE HCL 50 MG/ML IJ SOLN
INTRAMUSCULAR | Status: AC
  Filled 2013-03-14: qty 1

## 2013-03-14 MED ORDER — FENTANYL CITRATE 0.05 MG/ML IJ SOLN
INTRAMUSCULAR | Status: AC
  Filled 2013-03-14: qty 4

## 2013-03-14 MED ORDER — SODIUM CHLORIDE 0.9 % IV SOLN: INTRAVENOUS | Status: DC

## 2013-03-14 NOTE — Progress Notes (Signed)
[facility]                                                                                Patient Demographics  Zoe Jackson, is a 67 y.o. female, DOB - 25-Apr-1945, ZOX:[id number]  Admit date - 03/12/2013   Admitting Physician Barrie Folk, MD  Outpatient Primary MD for the patient is [name], MD  LOS - 2   Chief Complaint  Patient presents with  . Abdominal Pain        Assessment & Plan  Principal Problem:   Pancreatitis, acute Active Problems:   Pancreatitis   Hypokalemia   Hyponatremia   Transaminitis   Cholelithiasis   GERD (gastroesophageal reflux disease)  Intractable abdominal pain and nausea likely secondary to cholelithiasis with possible gallstone pancreatitis -LFTs, Lipase trending downward however total bilirubin continues to trend upward. -GI and surgery consulted -MRCP: Stable/chronic intra and extrahepatic biliary dilatation likely  due to distal common bile duct stricture. No common bile duct stones  are identified.  -Possible ERCP versus cholecystectomy to be determined -Will continue pain control, IVF, Unasyn  Hypokalemia -K 3.3, will replete and continue to monitor -Likely secondary dehydration and poor intake  Hyponatremia -Likely secondary dehydration and poor intake -Improving, Na 137, will continue to monitor -Continue IVF  Transaminitis -Will continue to monitor. Trending downward -Concern for common bile duct stone -Plan as above   GERD -Continue Protonix   Code Status: Full  Family Communication: None at this time  Disposition Plan: Admitted, expected length of stay additional 2-3 midnights  Procedures  MRCP  Consults   General surgery [facility]astro[facility]ology, Dr. Matthias Hughs  DVT Prophylaxis  Heparin   Lab Results  Component Value Date   PLT 247 03/14/2013    Medications  Scheduled Meds: . ampicillin-sulbactam (UNASYN) IV  1.5 g Intravenous Q6H  . heparin  5,000 Units Subcutaneous Q8H  . pantoprazole  (PROTONIX) IV  40 mg Intravenous Q12H   Continuous Infusions: . sodium chloride 200 mL/hr at 03/14/13 0836   PRN Meds:.acetaminophen, acetaminophen, morphine injection, ondansetron (ZOFRAN) IV, ondansetron  Antibiotics    Anti-infectives   Start     Dose/Rate Route Frequency Ordered Stop   03/13/13 1200  ampicillin-sulbactam (UNASYN) 1.5 g in sodium chloride 0.9 % 50 mL IVPB     1.5 g 100 mL/hr over 30 Minutes Intravenous Every 6 hours 03/13/13 1122         Time Spent in minutes   25 minutes   Shadell Brenn D.O. on 03/14/2013 at 11:07 AM  Between 7am to 7pm - Pager - [phone]  After 7pm go to [email or web address] - password [email or web address]  And look for the night coverage person covering for me after hours  [facility] Office  [phone]    Subjective:   Zoe Jackson seen and examined today.  Patient states abdominal pain has improved.   Patient denies dizziness, chest pain, shortness of breath, new weakness, numbess, tingling.    Objective:   Filed Vitals:   03/13/13 1403 03/13/13 1738 03/13/13 2212 03/14/13 0642  BP: 147/72 151/76 144/74 149/76  Pulse: 86 82 75 81  Temp: 98.6 F (37 C) 99 F (37.2 C) 98.3 F (  36.8 C) 98.4 F (36.9 C)  TempSrc: Oral Oral Oral   Resp: 20 18 18 18   Height:      Weight:      SpO2: 97% 96% 99% 99%    Wt Readings from Last 3 Encounters:  03/12/13 61.236 kg (135 lb)  03/12/13 61.236 kg (135 lb)     Intake/Output Summary (Last 24 hours) at 03/14/13 1107 Last data filed at 03/14/13 0645  Gross per 24 hour  Intake 7916.67 ml  Output      0 ml  Net 7916.67 ml    Exam  General: Well developed, well nourished, NAD, appears stated age  HEENT: NCAT, PERRLA, EOMI, Mildly icteic Sclera, mucous membranes moist. No pharyngeal erythema or exudates  Neck: Supple, no JVD, no masses  Cardiovascular: S1 S2 auscultated, no rubs, murmurs or gallops. Regular rate and rhythm.  Respiratory: Clear to auscultation bilaterally  with equal chest rise  Abdomen: Soft, tender to RUQ palpation, nondistended, + bowel sounds  Extremities: warm dry without cyanosis clubbing or edema  Neuro: AAOx3, cranial nerves grossly intact. Strength 5/5 in patient's upper and lower extremities bilaterally  Skin: Without rashes exudates or nodules  Psych: Normal affect and demeanor with intact judgement and insight  Data Review   Micro Results No results found for this or any previous visit (from the past 240 hour(s)).  Radiology Reports US Abdomen Complete  03/12/2013   CLINICAL DATA:  Abdominal pain, abnormal LFTs and lipase. Concern for choledocholithiasis or gallstone pancreatitis.  EXAM: ULTRASOUND ABDOMEN COMPLETE  COMPARISON:  CT abdomen / pelvis 01/07/2006  FINDINGS: Gallbladder:  Mobile echogenic shadowing foci within the gallbladder lumen consistent with cholelithiasis. No gallbladder wall thickening or pericholecystic fluid. Per the sonographer, the sonographic Eulah Pont sign was negative. A small echogenic focus is identified in the neck of the gallbladder, or proximal cystic duct. This is unlikely to be obstructing given the other findings described above.  Common bile duct:  Diameter: Dilated at 13 mm. Common bile duct is well seen to the upper pancreatic head. No choledocholithiasis. The most distal common bile duct is not seen.  Liver:  Mild intrahepatic biliary ductal dilatation. Fairly well defined 2 cm hyperechoic area in the posterior right hepatic lobe corresponds with a hemangioma seen on a prior CT scan. No additional hepatic lesion identified.  IVC:  No abnormality visualized.  Pancreas:  Pancreatic head is a very mildly hypoechoic which is nonspecific but can be seen with edema. No focal mass. Minimally prominent pancreatic duct at 2.4 mm.  Spleen:  Size and appearance within normal limits.  Right Kidney:  Length: 11.9 cm. Echogenicity within normal limits. No mass or hydronephrosis visualized.  Left Kidney:  Length: 12.5  cm. Echogenicity within normal limits. No mass or hydronephrosis visualized.  Abdominal aorta:  No aneurysm visualized.  Other findings:  None.  IMPRESSION: 1. Intra and extrahepatic biliary ductal dilatation in the setting of cholelithiasis is concerning for distal choledocholithiasis versus a distal biliary stricture. A pancreatic or ampullary mass is also a possibility. Recommend further evaluation with MRCP or ERCP. 2. Cholelithiasis without sonographic features of cholecystitis. 3. Mild hypo echogenicity of the pancreatic head as can be seen with edema in the setting of pancreatitis. 4. Small benign hemangioma in the right hepatic lobe.   Electronically Signed   By: Malachy Moan M.D.   On: 03/12/2013 10:36   Mr 3d Recon At Scanner  03/13/2013   CLINICAL DATA:  Right upper quadrant abdominal pain. Chololithiasis.  EXAM: MRI ABDOMEN WITHOUT AND WITH CONTRAST (INCLUDING MRCP)  TECHNIQUE: Multiplanar multisequence MR imaging of the abdomen was performed both before and after the administration of intravenous contrast. Heavily T2-weighted images of the biliary and pancreatic ducts were obtained, and three-dimensional MRCP images were rendered by post processing.  CONTRAST:  13mL MULTIHANCE GADOBENATE DIMEGLUMINE 529 MG/ML IV SOLN  COMPARISON:  Ultrasound 03/12/2013.  CT scan 01/07/2006.  FINDINGS: The liver demonstrates a stable hemangioma in the right hepatic lobe with classic MR findings and no change since CT scan of 2007. No other significant hepatic lesions. There is moderate intrahepatic biliary dilatation and moderate common bile duct dilatation. The common bile duct measures a maximum of 12 mm and head of the pancreas. This is a stable finding since 2007 and may be due to a distal stricture. No common bile duct stones are identified. The cystic duct is also slightly dilated and tortuous with a low insertion. Numerous gallstones are noted but no obvious cystic duct stones.  The pancreas and main  pancreatic duct are normal. The spleen is normal. The adrenal glands and kidneys are normal.  The aorta is normal in caliber. The major branch vessels are normal. No mesenteric or retroperitoneal mass or adenopathy.  IMPRESSION: 1. 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 No common bile duct stones are identified. 2. Chololithiasis. 3. Stable right hepatic lobe hemangioma.   Electronically Signed   By: Loralie Champagne M.D.   On: 03/13/2013 11:12   Mr Abd W/wo Cm/mrcp  03/13/2013   CLINICAL DATA:  Right upper quadrant abdominal pain. Chololithiasis.  EXAM: MRI ABDOMEN WITHOUT AND WITH CONTRAST (INCLUDING MRCP)  TECHNIQUE: Multiplanar multisequence MR imaging of the abdomen was performed both before and after the administration of intravenous contrast. Heavily T2-weighted images of the biliary and pancreatic ducts were obtained, and three-dimensional MRCP images were rendered by post processing.  CONTRAST:  13mL MULTIHANCE GADOBENATE DIMEGLUMINE 529 MG/ML IV SOLN  COMPARISON:  Ultrasound 03/12/2013.  CT scan 01/07/2006.  FINDINGS: The liver demonstrates a stable hemangioma in the right hepatic lobe with classic MR findings and no change since CT scan of 2007. No other significant hepatic lesions. There is moderate intrahepatic biliary dilatation and moderate common bile duct dilatation. The common bile duct measures a maximum of 12 mm and head of the pancreas. This is a stable finding since 2007 and may be due to a distal stricture. No common bile duct stones are identified. The cystic duct is also slightly dilated and tortuous with a low insertion. Numerous gallstones are noted but no obvious cystic duct stones.  The pancreas and main pancreatic duct are normal. The spleen is normal. The adrenal glands and kidneys are normal.  The aorta is normal in caliber. The major branch vessels are normal. No mesenteric or retroperitoneal mass or adenopathy.   IMPRESSION: 1. 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 No common bile duct stones are identified. 2. Chololithiasis. 3. Stable right hepatic lobe hemangioma.   Electronically Signed   By: Loralie Champagne M.D.   On: 03/13/2013 11:12    CBC  Recent Labs Lab 03/12/13 0600 03/13/13 0400 03/14/13 0545  WBC 6.1 5.1 5.3  HGB 14.7 12.7 13.1  HCT 41.5 37.5 37.6  PLT 254 219 247  MCV 89.1 91.0 89.5  MCH 31.5 30.8 31.2  MCHC 35.4 33.9 34.8  RDW 13.0 13.3 13.3  LYMPHSABS 0.7  --   --  MONOABS 0.4  --   --   EOSABS 0.0  --   --   BASOSABS 0.0  --   --     Chemistries   Recent Labs Lab 03/12/13 0600 03/12/13 1248 03/13/13 0400 03/14/13 0545  NA 131*  --  139 137  K 3.3*  --  3.6 3.3*  CL 95*  --  105 102  CO2 24  --  20 16*  GLUCOSE 110*  --  77 72  BUN 7  --  6 4*  CREATININE 0.55  --  0.57 0.53  CALCIUM 9.0  --  8.3* 8.4  MG  --  2.0  --   --   AST 331*  --  183* 166*  ALT 459*  --  318* 293*  ALKPHOS 165*  --  151* 186*  BILITOT 5.3* 5.2* 6.0* 7.4*   ------------------------------------------------------------------------------------------------------------------ estimated creatinine clearance is 64.8 ml/min (by C-G formula based on Cr of 0.53). ------------------------------------------------------------------------------------------------------------------ No results found for this basename: HGBA1C,  in the last 72 hours ------------------------------------------------------------------------------------------------------------------  Recent Labs  03/12/13 1248  CHOL 195  HDL 82  LDLCALC 103*  TRIG 52  CHOLHDL 2.4   ------------------------------------------------------------------------------------------------------------------  Recent Labs  03/12/13 1248  TSH 2.720   ------------------------------------------------------------------------------------------------------------------ No  results found for this basename: VITAMINB12, FOLATE, FERRITIN, TIBC, IRON, RETICCTPCT,  in the last 72 hours  Coagulation profile No results found for this basename: INR, PROTIME,  in the last 168 hours  No results found for this basename: DDIMER,  in the last 72 hours  Cardiac Enzymes No results found for this basename: CK, CKMB, TROPONINI, MYOGLOBIN,  in the last 168 hours ------------------------------------------------------------------------------------------------------------------ No components found with this basename: POCBNP,

## 2013-03-14 NOTE — Progress Notes (Signed)
Subjective: Feels okay.  Not much discomfort and pain has continued to decline  Objective: Vital signs in last 24 hours: Temp:  [97.9 F (36.6 C)-99 F (37.2 C)] 98.4 F (36.9 C) (12/07 0642) Pulse Rate:  [75-89] 81 (12/07 0642) Resp:  [18-20] 18 (12/07 0642) BP: (144-152)/(72-78) 149/76 mmHg (12/07 0642) SpO2:  [96 %-99 %] 99 % (12/07 0642) Last BM Date: 03/11/13  Intake/Output from previous day: 12/06 0701 - 12/07 0700 In: 7916.7 [I.V.:7916.7] Out: -  Intake/Output this shift:    General appearance: alert, cooperative and no distress GI: soft, NT, ND, no peritoneal signs  Lab Results:   Recent Labs  03/13/13 0400 03/14/13 0545  WBC 5.1 5.3  HGB 12.7 13.1  HCT 37.5 37.6  PLT 219 247   BMET  Recent Labs  03/13/13 0400 03/14/13 0545  NA 139 137  K 3.6 3.3*  CL 105 102  CO2 20 16*  GLUCOSE 77 72  BUN 6 4*  CREATININE 0.57 0.53  CALCIUM 8.3* 8.4   PT/INR No results found for this basename: LABPROT, INR,  in the last 72 hours ABG No results found for this basename: PHART, PCO2, PO2, HCO3,  in the last 72 hours  Studies/Results: US Abdomen Complete  03/12/2013   CLINICAL DATA:  Abdominal pain, abnormal LFTs and lipase. Concern for choledocholithiasis or gallstone pancreatitis.  EXAM: ULTRASOUND ABDOMEN COMPLETE  COMPARISON:  CT abdomen / pelvis 01/07/2006  FINDINGS: Gallbladder:  Mobile echogenic shadowing foci within the gallbladder lumen consistent with cholelithiasis. No gallbladder wall thickening or pericholecystic fluid. Per the sonographer, the sonographic Eulah Pont sign was negative. A small echogenic focus is identified in the neck of the gallbladder, or proximal cystic duct. This is unlikely to be obstructing given the other findings described above.  Common bile duct:  Diameter: Dilated at 13 mm. Common bile duct is well seen to the upper pancreatic head. No choledocholithiasis. The most distal common bile duct is not seen.  Liver:  Mild intrahepatic  biliary ductal dilatation. Fairly well defined 2 cm hyperechoic area in the posterior right hepatic lobe corresponds with a hemangioma seen on a prior CT scan. No additional hepatic lesion identified.  IVC:  No abnormality visualized.  Pancreas:  Pancreatic head is a very mildly hypoechoic which is nonspecific but can be seen with edema. No focal mass. Minimally prominent pancreatic duct at 2.4 mm.  Spleen:  Size and appearance within normal limits.  Right Kidney:  Length: 11.9 cm. Echogenicity within normal limits. No mass or hydronephrosis visualized.  Left Kidney:  Length: 12.5 cm. Echogenicity within normal limits. No mass or hydronephrosis visualized.  Abdominal aorta:  No aneurysm visualized.  Other findings:  None.  IMPRESSION: 1. Intra and extrahepatic biliary ductal dilatation in the setting of cholelithiasis is concerning for distal choledocholithiasis versus a distal biliary stricture. A pancreatic or ampullary mass is also a possibility. Recommend further evaluation with MRCP or ERCP. 2. Cholelithiasis without sonographic features of cholecystitis. 3. Mild hypo echogenicity of the pancreatic head as can be seen with edema in the setting of pancreatitis. 4. Small benign hemangioma in the right hepatic lobe.   Electronically Signed   By: Malachy Moan M.D.   On: 03/12/2013 10:36   Mr 3d Recon At Scanner  03/13/2013   CLINICAL DATA:  Right upper quadrant abdominal pain. Chololithiasis.  EXAM: MRI ABDOMEN WITHOUT AND WITH CONTRAST (INCLUDING MRCP)  TECHNIQUE: Multiplanar multisequence MR imaging of the abdomen was performed both before and after the administration  of intravenous contrast. Heavily T2-weighted images of the biliary and pancreatic ducts were obtained, and three-dimensional MRCP images were rendered by post processing.  CONTRAST:  13mL MULTIHANCE GADOBENATE DIMEGLUMINE 529 MG/ML IV SOLN  COMPARISON:  Ultrasound 03/12/2013.  CT scan 01/07/2006.  FINDINGS: The liver demonstrates a stable  hemangioma in the right hepatic lobe with classic MR findings and no change since CT scan of 2007. No other significant hepatic lesions. There is moderate intrahepatic biliary dilatation and moderate common bile duct dilatation. The common bile duct measures a maximum of 12 mm and head of the pancreas. This is a stable finding since 2007 and may be due to a distal stricture. No common bile duct stones are identified. The cystic duct is also slightly dilated and tortuous with a low insertion. Numerous gallstones are noted but no obvious cystic duct stones.  The pancreas and main pancreatic duct are normal. The spleen is normal. The adrenal glands and kidneys are normal.  The aorta is normal in caliber. The major branch vessels are normal. No mesenteric or retroperitoneal mass or adenopathy.  IMPRESSION: 1. 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 No common bile duct stones are identified. 2. Chololithiasis. 3. Stable right hepatic lobe hemangioma.   Electronically Signed   By: Loralie Champagne M.D.   On: 03/13/2013 11:12   Mr Abd W/wo Cm/mrcp  03/13/2013   CLINICAL DATA:  Right upper quadrant abdominal pain. Chololithiasis.  EXAM: MRI ABDOMEN WITHOUT AND WITH CONTRAST (INCLUDING MRCP)  TECHNIQUE: Multiplanar multisequence MR imaging of the abdomen was performed both before and after the administration of intravenous contrast. Heavily T2-weighted images of the biliary and pancreatic ducts were obtained, and three-dimensional MRCP images were rendered by post processing.  CONTRAST:  13mL MULTIHANCE GADOBENATE DIMEGLUMINE 529 MG/ML IV SOLN  COMPARISON:  Ultrasound 03/12/2013.  CT scan 01/07/2006.  FINDINGS: The liver demonstrates a stable hemangioma in the right hepatic lobe with classic MR findings and no change since CT scan of 2007. No other significant hepatic lesions. There is moderate intrahepatic biliary dilatation and moderate common bile duct  dilatation. The common bile duct measures a maximum of 12 mm and head of the pancreas. This is a stable finding since 2007 and may be due to a distal stricture. No common bile duct stones are identified. The cystic duct is also slightly dilated and tortuous with a low insertion. Numerous gallstones are noted but no obvious cystic duct stones.  The pancreas and main pancreatic duct are normal. The spleen is normal. The adrenal glands and kidneys are normal.  The aorta is normal in caliber. The major branch vessels are normal. No mesenteric or retroperitoneal mass or adenopathy.  IMPRESSION: 1. 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 No common bile duct stones are identified. 2. Chololithiasis. 3. Stable right hepatic lobe hemangioma.   Electronically Signed   By: Loralie Champagne M.D.   On: 03/13/2013 11:12    Anti-infectives: Anti-infectives   Start     Dose/Rate Route Frequency Ordered Stop   03/13/13 1200  ampicillin-sulbactam (UNASYN) 1.5 g in sodium chloride 0.9 % 50 mL IVPB     1.5 g 100 mL/hr over 30 Minutes Intravenous Every 6 hours 03/13/13 1122        Assessment/Plan: s/p Procedure(s): ENDOSCOPIC RETROGRADE CHOLANGIOPANCREATOGRAPHY (ERCP) (N/A) TB continues to rise.  I would favor ERCP prior to cholecystectomy to evaluate for cause of duct obstruction.  LOS: 2 days    Zoe Jackson 03/14/2013

## 2013-03-14 NOTE — Op Note (Signed)
[facility] [address] [place], [address]   ERCP PROCEDURE REPORT  PATIENT: Zoe Jackson, Zoe Jackson.  MR# :[id number] BIRTHDATE: 1945-12-30  GENDER: Female ENDOSCOPIST: Dorena Cookey, MD REFERRED BY: PROCEDURE DATE:  03/14/2013 PROCEDURE: ASA CLASS: INDICATIONS:pancreatitis with gallstones and dilated common bile duct MEDICATIONS: fentanyl 100 mcg, Versed 14 mg TOPICAL ANESTHETIC: Cetacaine spray  DESCRIPTION OF PROCEDURE:   After the risks benefits and alternatives of the procedure were thoroughly explained, informed consent was obtained.  The Pentax Ercp Scope [id number]  endoscope was introduced through the mouth  and advanced to the papilla of Vater.      .  it was large and protruding that had overlying normal appearing mucosa. Cannulation of the ampulla was difficult an initial attempts with a guidewire initially entering the pancreatic duct. Finally leaving a guidewire in the pancreatic duct we were selectively cannulate the common bile duct. It was grossly dilated including the intrahepatic ducts and somewhat contorted. There appeared to be a very short very distal stricture causing resistance to passage of the sphincterotome through it.   the proximal bile ducts appeared dilated with no filling defects. The distal the bile duct was somewhat difficult to opacify but no definite stones were seen. It was the impression that there was a distal stricture in the common bile duct. Brushings of the distal duct were obtained for cytology. After this a 5 cm 8.5 French stent was passed into the distal duct and there was good spillage of bile after was in position. The guidewire was removed from the pancreatic duct and the stent appeared to be in good position. The scope was then completely withdrawn from the patient and the procedure terminated.     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 No stones seen despite known gallstones RECOMMENDATIONS: Will await brushings, obtain CA 19-9 and monitor liver function tests and observe for complications. Will obtained dedicated CT of the pancreas to rule out pancreatic head tumor. Consider EUS or other diagnostic imaging as needed.    _______________________________ Rosalie DoctorDorena Cookey, MD 03/14/2013 2:26 PM   CC:

## 2013-03-14 NOTE — Progress Notes (Addendum)
[facility] Progress Note  Subjective: Without pain but feeling a little weaker and noticing more jaundice  Objective: Vital signs in last 24 hours: Temp:  [98.3 F (36.8 C)-99 F (37.2 C)] 98.4 F (36.9 C) (12/07 4098) Pulse Rate:  [75-108] 94 (12/07 1415) Resp:  [15-25] 25 (12/07 1415) BP: (143-177)/(74-104) 159/102 mmHg (12/07 1415) SpO2:  [96 %-100 %] 100 % (12/07 1415) Weight change:    PE: Unchanged  Lab Results: Results for orders placed during the hospital encounter of 03/12/13 (from the past 24 hour(s))  CBC     Status: None   Collection Time    03/14/13  5:45 AM      Result Value Range   WBC 5.3  4.0 - 10.5 K/uL   RBC 4.20  3.87 - 5.11 MIL/uL   Hemoglobin 13.1  12.0 - 15.0 g/dL   HCT 11.9  14.7 - 82.9 %   MCV 89.5  78.0 - 100.0 fL   MCH 31.2  26.0 - 34.0 pg   MCHC 34.8  30.0 - 36.0 g/dL   RDW 56.2  13.0 - 86.5 %   Platelets 247  150 - 400 K/uL  COMPREHENSIVE METABOLIC PANEL     Status: Abnormal   Collection Time    03/14/13  5:45 AM      Result Value Range   Sodium 137  135 - 145 mEq/L   Potassium 3.3 (*) 3.5 - 5.1 mEq/L   Chloride 102  96 - 112 mEq/L   CO2 16 (*) 19 - 32 mEq/L   Glucose, Bld 72  70 - 99 mg/dL   BUN 4 (*) 6 - 23 mg/dL   Creatinine, Ser 7.84  0.50 - 1.10 mg/dL   Calcium 8.4  8.4 - 69.6 mg/dL   Total Protein 6.8  6.0 - 8.3 g/dL   Albumin 3.2 (*) 3.5 - 5.2 g/dL   AST 295 (*) 0 - 37 U/L   ALT 293 (*) 0 - 35 U/L   Alkaline Phosphatase 186 (*) 39 - 117 U/L   Total Bilirubin 7.4 (*) 0.3 - 1.2 mg/dL   GFR calc non Af Amer >90  >90 mL/min   GFR calc Af Amer >90  >90 mL/min  LIPASE, BLOOD     Status: Abnormal   Collection Time    03/14/13  5:45 AM      Result Value Range   Lipase 446 (*) 11 - 59 U/L  NO BLOOD PRODUCTS     Status: None   Collection Time    03/14/13 10:05 AM      Result Value Range   Transfuse no blood products       Value: TRANSFUSE NO BLOOD PRODUCTS, VERIFIED BY Patrici Ranks RN    Studies/Results: Mr 3d  Recon At Scanner  03/13/2013   CLINICAL DATA:  Right upper quadrant abdominal pain. Chololithiasis.  EXAM: MRI ABDOMEN WITHOUT AND WITH CONTRAST (INCLUDING MRCP)  TECHNIQUE: Multiplanar multisequence MR imaging of the abdomen was performed both before and after the administration of intravenous contrast. Heavily T2-weighted images of the biliary and pancreatic ducts were obtained, and three-dimensional MRCP images were rendered by post processing.  CONTRAST:  13mL MULTIHANCE GADOBENATE DIMEGLUMINE 529 MG/ML IV SOLN  COMPARISON:  Ultrasound 03/12/2013.  CT scan 01/07/2006.  FINDINGS: The liver demonstrates a stable hemangioma in the right hepatic lobe with classic MR findings and no change since CT scan of 2007. No other significant hepatic lesions. There is moderate intrahepatic biliary dilatation and moderate  common bile duct dilatation. The common bile duct measures a maximum of 12 mm and head of the pancreas. This is a stable finding since 2007 and may be due to a distal stricture. No common bile duct stones are identified. The cystic duct is also slightly dilated and tortuous with a low insertion. Numerous gallstones are noted but no obvious cystic duct stones.  The pancreas and main pancreatic duct are normal. The spleen is normal. The adrenal glands and kidneys are normal.  The aorta is normal in caliber. The major branch vessels are normal. No mesenteric or retroperitoneal mass or adenopathy.  IMPRESSION: 1. 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 No common bile duct stones are identified. 2. Chololithiasis. 3. Stable right hepatic lobe hemangioma.   Electronically Signed   By: Loralie Champagne M.D.   On: 03/13/2013 11:12   Mr Abd W/wo Cm/mrcp  03/13/2013   CLINICAL DATA:  Right upper quadrant abdominal pain. Chololithiasis.  EXAM: MRI ABDOMEN WITHOUT AND WITH CONTRAST (INCLUDING MRCP)  TECHNIQUE: Multiplanar multisequence MR imaging of the  abdomen was performed both before and after the administration of intravenous contrast. Heavily T2-weighted images of the biliary and pancreatic ducts were obtained, and three-dimensional MRCP images were rendered by post processing.  CONTRAST:  13mL MULTIHANCE GADOBENATE DIMEGLUMINE 529 MG/ML IV SOLN  COMPARISON:  Ultrasound 03/12/2013.  CT scan 01/07/2006.  FINDINGS: The liver demonstrates a stable hemangioma in the right hepatic lobe with classic MR findings and no change since CT scan of 2007. No other significant hepatic lesions. There is moderate intrahepatic biliary dilatation and moderate common bile duct dilatation. The common bile duct measures a maximum of 12 mm and head of the pancreas. This is a stable finding since 2007 and may be due to a distal stricture. No common bile duct stones are identified. The cystic duct is also slightly dilated and tortuous with a low insertion. Numerous gallstones are noted but no obvious cystic duct stones.  The pancreas and main pancreatic duct are normal. The spleen is normal. The adrenal glands and kidneys are normal.  The aorta is normal in caliber. The major branch vessels are normal. No mesenteric or retroperitoneal mass or adenopathy.  IMPRESSION: 1. 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 No common bile duct stones are identified. 2. Chololithiasis. 3. Stable right hepatic lobe hemangioma.   Electronically Signed   By: Loralie Champagne M.D.   On: 03/13/2013 11:12   ERCP shows short distal stricture and no obvious common bile duct stones with diffuse biliary dilatation, somewhat worrisome for neoplasm. Brushings obtained and a stent placed in the bile duct   Assessment: 1. 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 Note that no stone was seen on MRCP yesterday. 2. Gallstones, cannot conclusively rule out  common bile duct stones but seems unlikely at present 3. Presentation of pancreatitis  Plan: 1. Will hold off on CT scan since MR of the pancreas was normal and noted that biliary ductal dilatation was present in 2007 by CT scan. 2. Await brushings and CA 19-9 3. Await liver function tests to monitor success of stent drainage 4. May need EUS at some point to better assess the nature of the distal stricture if above studies are unrevealing    Navarro Nine C 03/14/2013, 2:29 PM

## 2013-03-15 ENCOUNTER — Encounter (HOSPITAL_COMMUNITY): Payer: Self-pay | Admitting: Gastroenterology

## 2013-03-15 DIAGNOSIS — R740 Nonspecific elevation of levels of transaminase and lactic acid dehydrogenase [LDH]: Secondary | ICD-10-CM

## 2013-03-15 LAB — CBC
MCHC: 36.1 g/dL — ABNORMAL HIGH (ref 30.0–36.0)
MCV: 88.1 fL (ref 78.0–100.0)
Platelets: 293 10*3/uL (ref 150–400)
RDW: 13.5 % (ref 11.5–15.5)
WBC: 5.9 10*3/uL (ref 4.0–10.5)

## 2013-03-15 LAB — COMPREHENSIVE METABOLIC PANEL
AST: 150 U/L — ABNORMAL HIGH (ref 0–37)
Albumin: 3.6 g/dL (ref 3.5–5.2)
Alkaline Phosphatase: 198 U/L — ABNORMAL HIGH (ref 39–117)
Chloride: 104 mEq/L (ref 96–112)
Creatinine, Ser: 0.51 mg/dL (ref 0.50–1.10)
Potassium: 3.5 mEq/L (ref 3.5–5.1)
Total Bilirubin: 2.8 mg/dL — ABNORMAL HIGH (ref 0.3–1.2)
Total Protein: 7.5 g/dL (ref 6.0–8.3)

## 2013-03-15 LAB — CANCER ANTIGEN 19-9: CA 19-9: 163.8 U/mL — ABNORMAL HIGH (ref <35.0)

## 2013-03-15 LAB — OCCULT BLOOD X 1 CARD TO LAB, STOOL: Fecal Occult Bld: NEGATIVE

## 2013-03-15 NOTE — Progress Notes (Signed)
Patient ID: Zoe Jackson, female   DOB: 1945/06/06, 67 y.o.   MRN: [id number] [facility] Progress Note  Ashli Selders Abdallah 67 y.o. June 23, 1945   Subjective: Feels ok. Denies abdominal pain/N/V.  Objective: Vital signs in last 24 hours: Filed Vitals:   03/15/13 0646  BP: 147/81  Pulse: 77  Temp: 97.8 F (36.6 C)  Resp: 18    Physical Exam: Gen: alert, no acute distress Abd: minimal epigastric tenderness with guarding, otherwise nontender, soft, nondistended, +BS  Lab Results:  Recent Labs  03/12/13 1248  03/14/13 0545 03/15/13 0654  NA  --   < > 137 137  K  --   < > 3.3* 3.5  CL  --   < > 102 104  CO2  --   < > 16* 20  GLUCOSE  --   < > 72 114*  BUN  --   < > 4* 5*  CREATININE  --   < > 0.53 0.51  CALCIUM  --   < > 8.4 8.9  MG 2.0  --   --   --   PHOS 2.6  --   --   --   < > = values in this interval not displayed.  Recent Labs  03/14/13 0545 03/15/13 0654  AST 166* 150*  ALT 293* 297*  ALKPHOS 186* 198*  BILITOT 7.4* 2.8*  PROT 6.8 7.5  ALBUMIN 3.2* 3.6    Recent Labs  03/14/13 0545 03/15/13 0654  WBC 5.3 5.9  HGB 13.1 14.2  HCT 37.6 39.3  MCV 89.5 88.1  PLT 247 293   No results found for this basename: LABPROT, INR,  in the last 72 hours    Assessment/Plan: 67 yo s/p ERCP for a distal biliary stricture with stenting. TB improving. Other LFTs no significant change. Awaiting biliary duct brushing and CA 19-9. Hold off on EUS at this time. Surgery following and awaiting path. Supportive care. On clears. Will follow.   Tina Temme C. 03/15/2013, 11:55 AM

## 2013-03-15 NOTE — Progress Notes (Signed)
1 Day Post-Op  Subjective: Patient feels much better today overall. No abdominal pain.  Eating some clear liquids without any issues.  Objective: Vital signs in last 24 hours: Temp:  [97.8 F (36.6 C)-98.2 F (36.8 C)] 97.8 F (36.6 C) (12/08 0646) Pulse Rate:  [69-108] 77 (12/08 0646) Resp:  [15-25] 18 (12/08 0646) BP: (126-177)/(68-104) 147/81 mmHg (12/08 0646) SpO2:  [98 %-100 %] 99 % (12/08 0646) Last BM Date: 03/11/13  Intake/Output from previous day: 12/07 0701 - 12/08 0700 In: 2880 [P.O.:480; I.V.:2400] Out: -  Intake/Output this shift:    PE: Card:  RRR, no M/G/R heard Pulm:  CTA Abd: Soft, NT/ND, +BS  Lab Results:   Recent Labs  03/14/13 0545 03/15/13 0654  WBC 5.3 5.9  HGB 13.1 14.2  HCT 37.6 39.3  PLT 247 293   BMET  Recent Labs  03/14/13 0545 03/15/13 0654  NA 137 137  K 3.3* 3.5  CL 102 104  CO2 16* 20  GLUCOSE 72 114*  BUN 4* 5*  CREATININE 0.53 0.51  CALCIUM 8.4 8.9   PT/INR No results found for this basename: LABPROT, INR,  in the last 72 hours CMP     Component Value Date/Time   NA 137 03/15/2013 0654   K 3.5 03/15/2013 0654   CL 104 03/15/2013 0654   CO2 20 03/15/2013 0654   GLUCOSE 114* 03/15/2013 0654   BUN 5* 03/15/2013 0654   CREATININE 0.51 03/15/2013 0654   CALCIUM 8.9 03/15/2013 0654   PROT 7.5 03/15/2013 0654   ALBUMIN 3.6 03/15/2013 0654   AST 150* 03/15/2013 0654   ALT 297* 03/15/2013 0654   ALKPHOS 198* 03/15/2013 0654   BILITOT 2.8* 03/15/2013 0654   GFRNONAA >90 03/15/2013 0654   GFRAA >90 03/15/2013 0654   Lipase     Component Value Date/Time   LIPASE 184* 03/15/2013 0654       Studies/Results: Dg Ercp Biliary & Pancreatic Ducts  03/14/2013   CLINICAL DATA:  Cholelithiasis. Chronic biliary obstruction fell most likely due to a distal common duct stricture at MRCP yesterday.  EXAM: ERCP with sphincterotomy and stent placement  TECHNIQUE: Multiple spot images obtained with the fluoroscopic device and submitted for  interpretation post-procedure.  COMPARISON:  MRCP dated 03/13/2013 and abdomen ultrasound dated 03/12/2013.  FINDINGS: Endoscope in place with contrast opacification of a dilated common duct and intrahepatic ducts. No intraductal filling defects are seen. There is tapering of the distal common duct with subsequent placement of a stent across the area of tapering.  IMPRESSION: Biliary dilatation, most likely due to a distal common duct structure, with placement of a bridging stent.  These images were submitted for radiologic interpretation only. Please see the procedural report for the amount of contrast and the fluoroscopy time utilized.   Electronically Signed   By: Gordan Payment M.D.   On: 03/14/2013 19:02    Anti-infectives: Anti-infectives   Start     Dose/Rate Route Frequency Ordered Stop   03/13/13 1200  ampicillin-sulbactam (UNASYN) 1.5 g in sodium chloride 0.9 % 50 mL IVPB     1.5 g 100 mL/hr over 30 Minutes Intravenous Every 6 hours 03/13/13 1122         Assessment/Plan Pancreatitis  Gallstones  Dilated intra/extrahepatic biliary ducts  Biliary obstruction ampula stricture POD #1 s/p ERCP with brushings, ampula stent placement Hyperbilirubinemia  Transaminitis  H/o ovarian cancer  Plan: 1.  Await brushings and CA19-9 prior to proceeding with surgery.  If any concerns  with these, may need EUS. 2.  LFTS relatively stable except TB has decreased after stent placement.  3. Cont diet for now.   LOS: 3 days    Sherald Balbuena E 11:03 AM 03/15/2013 Page: [phone]

## 2013-03-15 NOTE — Progress Notes (Signed)
General Surgery [facility]  Patient seen and examined.  Well known to my practice.  Await cytopathology and lab results.  Will follow closely.  Does have symptomatic gallstones originally diagnosed in 2006.  Velora Heckler, MD, [facility] Office: [phone]

## 2013-03-15 NOTE — Progress Notes (Signed)
[facility]                                                                                Patient Demographics  Zoe Jackson, is a 67 y.o. female, DOB - 06-Nov-1945, ZOX:[id number]  Admit date - 03/12/2013   Admitting Physician Barrie Folk, MD  Outpatient Primary MD for the patient is [name], MD  LOS - 3   Chief Complaint  Patient presents with  . 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 No common bile duct stones  are identified. -ERCP done 12/7 with stent placment -Will continue pain control, IVF, Unasyn -Pending CA19-9  Hypokalemia -K 3.5, will continue to monitor -Likely secondary dehydration and poor intake  Hyponatremia -Likely secondary dehydration and poor intake -Improving, Na 137, will continue to monitor -Continue IVF  Transaminitis -Will continue to monitor. Trending downward -Concern for common bile duct stone -Plan as above   GERD -Continue Protonix   Code Status: Full  Family Communication: None at this time  Disposition Plan: Admitted  Procedures  MRCP ERCP with stent placement  Consults   General surgery Gastroenterology, Dr. Matthias Hughs  DVT Prophylaxis  Heparin   Lab Results  Component Value Date   PLT 293 03/15/2013    Medications  Scheduled Meds: . ampicillin-sulbactam (UNASYN) IV  1.5 g Intravenous Q6H  . heparin  5,000 Units Subcutaneous Q8H  . pantoprazole (PROTONIX) IV  40 mg Intravenous Q12H   Continuous Infusions: . sodium  chloride 200 mL/hr at 03/14/13 1514  . sodium chloride     PRN Meds:.acetaminophen, acetaminophen, morphine injection, ondansetron (ZOFRAN) IV, ondansetron  Antibiotics    Anti-infectives   Start     Dose/Rate Route Frequency Ordered Stop   03/13/13 1200  ampicillin-sulbactam (UNASYN) 1.5 g in sodium chloride 0.9 % 50 mL IVPB     1.5 g 100 mL/hr over 30 Minutes Intravenous Every 6 hours 03/13/13 1122         Time Spent in minutes   25 minutes   Emerald Gehres D.O. on 03/15/2013 at 11:05 AM  Between 7am to 7pm - Pager - [phone]  After 7pm go to [email or web address] - password [email or web address]  And look for the night coverage person covering for me after hours  [facility] Office  [phone]    Subjective:   Zoe Jackson seen and examined today.  Patient states abdominal pain has improved.  She is feeling much better.  Patient denies dizziness, chest pain, shortness of breath, new weakness, numbess, tingling.    Objective:   Filed Vitals:   03/14/13 1519 03/14/13 1848 03/14/13 2206 03/15/13 0646  BP: 134/76 127/68 146/78 147/81  Pulse: 85 76 69 77  Temp: 97.8 F (36.6 C) 98.2 F (36.8 C)  98.2 F (36.8 C) 97.8 F (36.6 C)  TempSrc: Oral Oral Oral   Resp: 18 18 18 18   Height:      Weight:      SpO2: 100% 98% 99% 99%    Wt Readings from Last 3 Encounters:  03/12/13 61.236 kg (135 lb)  03/12/13 61.236 kg (135 lb)     Intake/Output Summary (Last 24 hours) at 03/15/13 1105 Last data filed at 03/15/13 0272  Gross per 24 hour  Intake   2880 ml  Output      0 ml  Net   2880 ml    Exam  General: Well developed, well nourished, NAD, appears stated age  HEENT: NCAT,  Mildly icteic Sclera, mucous membranes moist.   Neck: Supple, no JVD, no masses  Cardiovascular: S1 S2 auscultated, no rubs, murmurs or gallops. Regular rate and rhythm.  Respiratory: Clear to auscultation bilaterally with equal chest rise  Abdomen: Soft, tender to RUQ palpation,  nondistended, + bowel sounds  Extremities: warm dry without cyanosis clubbing or edema  Neuro: AAOx3, cranial nerves grossly intact.   Skin: Without rashes exudates or nodules  Psych: Normal affect and demeanor with intact judgement and insight, anxious  Data Review   Micro Results No results found for this or any previous visit (from the past 240 hour(s)).  Radiology Reports US Abdomen Complete  03/12/2013   CLINICAL DATA:  Abdominal pain, abnormal LFTs and lipase. Concern for choledocholithiasis or gallstone pancreatitis.  EXAM: ULTRASOUND ABDOMEN COMPLETE  COMPARISON:  CT abdomen / pelvis 01/07/2006  FINDINGS: Gallbladder:  Mobile echogenic shadowing foci within the gallbladder lumen consistent with cholelithiasis. No gallbladder wall thickening or pericholecystic fluid. Per the sonographer, the sonographic Eulah Pont sign was negative. A small echogenic focus is identified in the neck of the gallbladder, or proximal cystic duct. This is unlikely to be obstructing given the other findings described above.  Common bile duct:  Diameter: Dilated at 13 mm. Common bile duct is well seen to the upper pancreatic head. No choledocholithiasis. The most distal common bile duct is not seen.  Liver:  Mild intrahepatic biliary ductal dilatation. Fairly well defined 2 cm hyperechoic area in the posterior right hepatic lobe corresponds with a hemangioma seen on a prior CT scan. No additional hepatic lesion identified.  IVC:  No abnormality visualized.  Pancreas:  Pancreatic head is a very mildly hypoechoic which is nonspecific but can be seen with edema. No focal mass. Minimally prominent pancreatic duct at 2.4 mm.  Spleen:  Size and appearance within normal limits.  Right Kidney:  Length: 11.9 cm. Echogenicity within normal limits. No mass or hydronephrosis visualized.  Left Kidney:  Length: 12.5 cm. Echogenicity within normal limits. No mass or hydronephrosis visualized.  Abdominal aorta:  No aneurysm visualized.   Other findings:  None.  IMPRESSION: 1. Intra and extrahepatic biliary ductal dilatation in the setting of cholelithiasis is concerning for distal choledocholithiasis versus a distal biliary stricture. A pancreatic or ampullary mass is also a possibility. Recommend further evaluation with MRCP or ERCP. 2. Cholelithiasis without sonographic features of cholecystitis. 3. Mild hypo echogenicity of the pancreatic head as can be seen with edema in the setting of pancreatitis. 4. Small benign hemangioma in the right hepatic lobe.   Electronically Signed   By: Malachy Moan M.D.   On: 03/12/2013 10:36   Mr 3d Recon At Scanner  03/13/2013   CLINICAL DATA:  Right upper quadrant abdominal pain. Chololithiasis.  EXAM: MRI ABDOMEN WITHOUT AND WITH  CONTRAST (INCLUDING MRCP)  TECHNIQUE: Multiplanar multisequence MR imaging of the abdomen was performed both before and after the administration of intravenous contrast. Heavily T2-weighted images of the biliary and pancreatic ducts were obtained, and three-dimensional MRCP images were rendered by post processing.  CONTRAST:  13mL MULTIHANCE GADOBENATE DIMEGLUMINE 529 MG/ML IV SOLN  COMPARISON:  Ultrasound 03/12/2013.  CT scan 01/07/2006.  FINDINGS: The liver demonstrates a stable hemangioma in the right hepatic lobe with classic MR findings and no change since CT scan of 2007. No other significant hepatic lesions. There is moderate intrahepatic biliary dilatation and moderate common bile duct dilatation. The common bile duct measures a maximum of 12 mm and head of the pancreas. This is a stable finding since 2007 and may be due to a distal stricture. No common bile duct stones are identified. The cystic duct is also slightly dilated and tortuous with a low insertion. Numerous gallstones are noted but no obvious cystic duct stones.  The pancreas and main pancreatic duct are normal. The spleen is normal. The adrenal glands and kidneys are normal.  The aorta is normal in caliber.  The major branch vessels are normal. No mesenteric or retroperitoneal mass or adenopathy.  IMPRESSION: 1. 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 No common bile duct stones are identified. 2. Chololithiasis. 3. Stable right hepatic lobe hemangioma.   Electronically Signed   By: Loralie Champagne M.D.   On: 03/13/2013 11:12   Mr Abd W/wo Cm/mrcp  03/13/2013   CLINICAL DATA:  Right upper quadrant abdominal pain. Chololithiasis.  EXAM: MRI ABDOMEN WITHOUT AND WITH CONTRAST (INCLUDING MRCP)  TECHNIQUE: Multiplanar multisequence MR imaging of the abdomen was performed both before and after the administration of intravenous contrast. Heavily T2-weighted images of the biliary and pancreatic ducts were obtained, and three-dimensional MRCP images were rendered by post processing.  CONTRAST:  13mL MULTIHANCE GADOBENATE DIMEGLUMINE 529 MG/ML IV SOLN  COMPARISON:  Ultrasound 03/12/2013.  CT scan 01/07/2006.  FINDINGS: The liver demonstrates a stable hemangioma in the right hepatic lobe with classic MR findings and no change since CT scan of 2007. No other significant hepatic lesions. There is moderate intrahepatic biliary dilatation and moderate common bile duct dilatation. The common bile duct measures a maximum of 12 mm and head of the pancreas. This is a stable finding since 2007 and may be due to a distal stricture. No common bile duct stones are identified. The cystic duct is also slightly dilated and tortuous with a low insertion. Numerous gallstones are noted but no obvious cystic duct stones.  The pancreas and main pancreatic duct are normal. The spleen is normal. The adrenal glands and kidneys are normal.  The aorta is normal in caliber. The major branch vessels are normal. No mesenteric or retroperitoneal mass or adenopathy.  IMPRESSION: 1. 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 No  common bile duct stones are identified. 2. Chololithiasis. 3. Stable right hepatic lobe hemangioma.   Electronically Signed   By: Loralie Champagne M.D.   On: 03/13/2013 11:12    CBC  Recent Labs Lab 03/12/13 0600 03/13/13 0400 03/14/13 0545 03/15/13 0654  WBC 6.1 5.1 5.3 5.9  HGB 14.7 12.7 13.1 14.2  HCT 41.5 37.5 37.6 39.3  PLT 254 219 247 293  MCV 89.1 91.0 89.5 88.1  MCH 31.5 30.8 31.2 31.8  MCHC 35.4 33.9 34.8 36.1*  RDW 13.0 13.3 13.3 13.5  LYMPHSABS  0.7  --   --   --   MONOABS 0.4  --   --   --   EOSABS 0.0  --   --   --   BASOSABS 0.0  --   --   --     Chemistries   Recent Labs Lab 03/12/13 0600 03/12/13 1248 03/13/13 0400 03/14/13 0545 03/15/13 0654  NA 131*  --  139 137 137  K 3.3*  --  3.6 3.3* 3.5  CL 95*  --  105 102 104  CO2 24  --  20 16* 20  GLUCOSE 110*  --  77 72 114*  BUN 7  --  6 4* 5*  CREATININE 0.55  --  0.57 0.53 0.51  CALCIUM 9.0  --  8.3* 8.4 8.9  MG  --  2.0  --   --   --   AST 331*  --  183* 166* 150*  ALT 459*  --  318* 293* 297*  ALKPHOS 165*  --  151* 186* 198*  BILITOT 5.3* 5.2* 6.0* 7.4* 2.8*   ------------------------------------------------------------------------------------------------------------------ estimated creatinine clearance is 64.8 ml/min (by C-G formula based on Cr of 0.51). ------------------------------------------------------------------------------------------------------------------ No results found for this basename: HGBA1C,  in the last 72 hours ------------------------------------------------------------------------------------------------------------------  Recent Labs  03/12/13 1248  CHOL 195  HDL 82  LDLCALC 103*  TRIG 52  CHOLHDL 2.4   ------------------------------------------------------------------------------------------------------------------  Recent Labs  03/12/13 1248  TSH 2.720    ------------------------------------------------------------------------------------------------------------------ No results found for this basename: VITAMINB12, FOLATE, FERRITIN, TIBC, IRON, RETICCTPCT,  in the last 72 hours  Coagulation profile No results found for this basename: INR, PROTIME,  in the last 168 hours  No results found for this basename: DDIMER,  in the last 72 hours  Cardiac Enzymes No results found for this basename: CK, CKMB, TROPONINI, MYOGLOBIN,  in the last 168 hours ------------------------------------------------------------------------------------------------------------------ No components found with this basename: POCBNP,

## 2013-03-16 LAB — COMPREHENSIVE METABOLIC PANEL
AST: 112 U/L — ABNORMAL HIGH (ref 0–37)
Albumin: 3.2 g/dL — ABNORMAL LOW (ref 3.5–5.2)
Alkaline Phosphatase: 152 U/L — ABNORMAL HIGH (ref 39–117)
BUN: <3 mg/dL — ABNORMAL LOW (ref 6–23)
CO2: 27 mEq/L (ref 19–32)
Chloride: 105 mEq/L (ref 96–112)
Creatinine, Ser: 0.46 mg/dL — ABNORMAL LOW (ref 0.50–1.10)
GFR calc non Af Amer: >90 mL/min (ref >90)
Potassium: 2.9 mEq/L — ABNORMAL LOW (ref 3.5–5.1)
Total Bilirubin: 1.7 mg/dL — ABNORMAL HIGH (ref 0.3–1.2)
Total Protein: 6.8 g/dL (ref 6.0–8.3)

## 2013-03-16 LAB — LIPASE, BLOOD: Lipase: 159 U/L — ABNORMAL HIGH (ref 11–59)

## 2013-03-16 MED ORDER — PANTOPRAZOLE SODIUM 40 MG PO TBEC: 40.0000 mg | DELAYED_RELEASE_TABLET | Freq: Every day | ORAL | Status: DC

## 2013-03-16 MED ORDER — POTASSIUM CHLORIDE 20 MEQ/15ML (10%) PO LIQD
40.0000 meq | Freq: Two times a day (BID) | ORAL | Status: DC
  Administered 2013-03-16: 40 meq via ORAL
  Filled 2013-03-16 (×2): qty 30

## 2013-03-16 NOTE — Discharge Summary (Signed)
Physician Discharge Summary  Zoe Jackson ZOX:[id number] DOB: 03/18/1946 DOA: 03/12/2013  PCP: [name], MD  Admit date: 03/12/2013 Discharge date: 03/16/2013  Time spent: 35 minutes  Recommendations for Outpatient Follow-up:  Follow up with Primary care physician within one week of discharge. Have BMP drawn within one week for potassium. Follow up with Dr. Dulce Sellar, gastroenterologist, within one week. Follow low fat diet.  Discharge Diagnoses:  Principal Problem:   Pancreatitis, acute Active Problems:   Pancreatitis   Hypokalemia   Hyponatremia   Transaminitis   Cholelithiasis   GERD (gastroesophageal reflux disease)   Discharge Condition: Stable  Diet recommendation: Low fat diet  Filed Weights   03/12/13 0458 03/12/13 1037  Weight: 61.236 kg (135 lb) 61.236 kg (135 lb)    History of present illness:  Zoe Jackson is a 67 y.o. female with PMH of ovarian cancer (s/p hysterectomy and bilat salpingo-oforectomy), SBO with needs for surgical lysis of adhesions (Done by Dr. Derrell Lolling) and cholelithiasis diagnosed in 2005 and confirmed with CT abd in 2007; came to Ed complaining of epigastric and RUQ pain with associated nausea and vomiting. Patient reports symptoms present for the last 3 days or so, on/off initially and worse on day of admission. Patient denies CP, SOB, cough, hematemesis, melena, fever chills or any other complaints.  In ED patient had elevated bilirubin, elevated LFT's and a lipase in the 1300 range. Abd US demonstrated positive intra/extrahepatic biliary and CBD dilatation. TRH called to admit for further evaluation and treatment.   Hospital Course:  This is a 67 year old female with history of ovarian cancer status post hysterectomy and bilateral salpingo-oophorectomy, SBO with surgical lysis, cholelithiasis diagnosed in 2005 minutes presents emergency department with complaints of right upper quadrant pain as well as nausea and vomiting. Abdominal  ultrasound demonstrated positive intra-and extrahepatic biliary and CBD dilation. Patient was noted to have elevated bilirubin, LFTs as well as lipase. She was admitted for gallstone pancreatitis. Gastroenterology as well as surgery were consulted. Patient underwent MRCP as well as ERCP with stent placement. It seems that patient's stent would need to be removed in order to look for any subtle lesions in the bile duct and removal of the stent should not occur until patient is clinically better.  Patient is to follow up with Dr. Dulce Sellar in the office next week regarding possible ultrasound and further imaging.  Patient's diet was advanced and she was able to tolerate her diet. Gastroenterology felt the patient was stable for discharge today.    Assessment/Plan Intractable abdominal pain and nausea likely secondary to cholelithiasis with possible gallstone pancreatitis  -LFTs, Lipase, total bilirubin trending downward  -GI and surgery consulted  -MRCP: Stable/chronic intra and extrahepatic biliary dilatation likely  due to distal common bile duct stricture. No common bile duct stones  are identified.  -ERCP done 12/7 with stent placment  -Patient was placed on unasyn.  Patient will not require antibiotics at discharge.  She is currently afebrile with no leukocytosis.   -CA19-9 163.8  -Will need follow up with Dr. Dulce Sellar  Hypokalemia  -K 2.8, will repleted -Patient will need to have her labs checked within 2-3 days for potassium levels -Likely secondary dehydration and poor intake   Hyponatremia  -Likely secondary dehydration and poor intake  -Improving, Na 142, patient was placed on IV Fluids.  Transaminitis  -Will continue to monitor. Trending downward  -Concern for common bile duct stone  -Plan as above    GERD  -Continue Protonix  Procedures: MRCP  ERCP with stent placement  Consultations: General surgery Gastroenterology  Discharge Exam: Filed Vitals:   03/16/13 0519  BP:  144/77  Pulse: 96  Temp: 98.1 F (36.7 C)  Resp: 20   Exam  General: Well developed, well nourished, NAD, appears stated age  HEENT: NCAT, Mildly icteic Sclera, mucous membranes moist.  Neck: Supple, no JVD, no masses  Cardiovascular: S1 S2 auscultated, no rubs, murmurs or gallops. Regular rate and rhythm.  Respiratory: Clear to auscultation bilaterally with equal chest rise  Abdomen: Soft, tender to RUQ palpation, nondistended, + bowel sounds  Extremities: warm dry without cyanosis clubbing or edema  Neuro: AAOx3, cranial nerves grossly intact.  Skin: Without rashes exudates or nodules  Psych: Normal affect and demeanor with intact judgement and insight, anxious  Discharge Instructions      Discharge Orders   Future Orders Complete By Expires   Discharge instructions  As directed    Comments:     Follow up with Primary care physician within one week of discharge.  Have BMP drawn within one week for potassium.  Follow up with Dr. Dulce Sellar, gastroenterologist, within one week.  Follow low fat diet.   Increase activity slowly  As directed        Medication List         pantoprazole 40 MG tablet  Commonly known as:  PROTONIX  Take 1 tablet (40 mg total) by mouth daily.       Allergies  Allergen Reactions  . Azithromycin Other (See Comments)    "turned tongue black"  . Other     No Blood Transfusion please   Follow-up Information   Follow up with WHITE,CYNTHIA S, MD. Schedule an appointment as soon as possible for a visit in 1 week.   Specialty:  Family Medicine   Contact information:   [address] [place] [address] [phone]       Follow up with Freddy Jaksch, MD. Schedule an appointment as soon as possible for a visit in 1 week.   Specialty:  Gastroenterology   Contact information:   [address] [place] [address] [phone]        The results of significant diagnostics from this hospitalization (including  imaging, microbiology, ancillary and laboratory) are listed below for reference.    Significant Diagnostic Studies: US Abdomen Complete  03/12/2013   CLINICAL DATA:  Abdominal pain, abnormal LFTs and lipase. Concern for choledocholithiasis or gallstone pancreatitis.  EXAM: ULTRASOUND ABDOMEN COMPLETE  COMPARISON:  CT abdomen / pelvis 01/07/2006  FINDINGS: Gallbladder:  Mobile echogenic shadowing foci within the gallbladder lumen consistent with cholelithiasis. No gallbladder wall thickening or pericholecystic fluid. Per the sonographer, the sonographic Eulah Pont sign was negative. A small echogenic focus is identified in the neck of the gallbladder, or proximal cystic duct. This is unlikely to be obstructing given the other findings described above.  Common bile duct:  Diameter: Dilated at 13 mm. Common bile duct is well seen to the upper pancreatic head. No choledocholithiasis. The most distal common bile duct is not seen.  Liver:  Mild intrahepatic biliary ductal dilatation. Fairly well defined 2 cm hyperechoic area in the posterior right hepatic lobe corresponds with a hemangioma seen on a prior CT scan. No additional hepatic lesion identified.  IVC:  No abnormality visualized.  Pancreas:  Pancreatic head is a very mildly hypoechoic which is nonspecific but can be seen with edema. No focal mass. Minimally prominent pancreatic  duct at 2.4 mm.  Spleen:  Size and appearance within normal limits.  Right Kidney:  Length: 11.9 cm. Echogenicity within normal limits. No mass or hydronephrosis visualized.  Left Kidney:  Length: 12.5 cm. Echogenicity within normal limits. No mass or hydronephrosis visualized.  Abdominal aorta:  No aneurysm visualized.  Other findings:  None.  IMPRESSION: 1. Intra and extrahepatic biliary ductal dilatation in the setting of cholelithiasis is concerning for distal choledocholithiasis versus a distal biliary stricture. A pancreatic or ampullary mass is also a possibility. Recommend further  evaluation with MRCP or ERCP. 2. Cholelithiasis without sonographic features of cholecystitis. 3. Mild hypo echogenicity of the pancreatic head as can be seen with edema in the setting of pancreatitis. 4. Small benign hemangioma in the right hepatic lobe.   Electronically Signed   By: Malachy Moan M.D.   On: 03/12/2013 10:36   Mr 3d Recon At Scanner  03/13/2013   CLINICAL DATA:  Right upper quadrant abdominal pain. Chololithiasis.  EXAM: MRI ABDOMEN WITHOUT AND WITH CONTRAST (INCLUDING MRCP)  TECHNIQUE: Multiplanar multisequence MR imaging of the abdomen was performed both before and after the administration of intravenous contrast. Heavily T2-weighted images of the biliary and pancreatic ducts were obtained, and three-dimensional MRCP images were rendered by post processing.  CONTRAST:  13mL MULTIHANCE GADOBENATE DIMEGLUMINE 529 MG/ML IV SOLN  COMPARISON:  Ultrasound 03/12/2013.  CT scan 01/07/2006.  FINDINGS: The liver demonstrates a stable hemangioma in the right hepatic lobe with classic MR findings and no change since CT scan of 2007. No other significant hepatic lesions. There is moderate intrahepatic biliary dilatation and moderate common bile duct dilatation. The common bile duct measures a maximum of 12 mm and head of the pancreas. This is a stable finding since 2007 and may be due to a distal stricture. No common bile duct stones are identified. The cystic duct is also slightly dilated and tortuous with a low insertion. Numerous gallstones are noted but no obvious cystic duct stones.  The pancreas and main pancreatic duct are normal. The spleen is normal. The adrenal glands and kidneys are normal.  The aorta is normal in caliber. The major branch vessels are normal. No mesenteric or retroperitoneal mass or adenopathy.  IMPRESSION: 1. 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 No common bile duct stones are identified. 2. Chololithiasis.  3. Stable right hepatic lobe hemangioma.   Electronically Signed   By: Loralie Champagne M.D.   On: 03/13/2013 11:12   Dg Ercp Biliary & Pancreatic Ducts  03/14/2013   CLINICAL DATA:  Cholelithiasis. Chronic biliary obstruction fell most likely due to a distal common duct stricture at MRCP yesterday.  EXAM: ERCP with sphincterotomy and stent placement  TECHNIQUE: Multiple spot images obtained with the fluoroscopic device and submitted for interpretation post-procedure.  COMPARISON:  MRCP dated 03/13/2013 and abdomen ultrasound dated 03/12/2013.  FINDINGS: Endoscope in place with contrast opacification of a dilated common duct and intrahepatic ducts. No intraductal filling defects are seen. There is tapering of the distal common duct with subsequent placement of a stent across the area of tapering.  IMPRESSION: Biliary dilatation, most likely due to a distal common duct structure, with placement of a bridging stent.  These images were submitted for radiologic interpretation only. Please see the procedural report for the amount of contrast and the fluoroscopy time utilized.   Electronically Signed   By: Gordan Payment M.D.   On: 03/14/2013 19:02   Mr Abd  W/wo Cm/mrcp  03/13/2013   CLINICAL DATA:  Right upper quadrant abdominal pain. Chololithiasis.  EXAM: MRI ABDOMEN WITHOUT AND WITH CONTRAST (INCLUDING MRCP)  TECHNIQUE: Multiplanar multisequence MR imaging of the abdomen was performed both before and after the administration of intravenous contrast. Heavily T2-weighted images of the biliary and pancreatic ducts were obtained, and three-dimensional MRCP images were rendered by post processing.  CONTRAST:  13mL MULTIHANCE GADOBENATE DIMEGLUMINE 529 MG/ML IV SOLN  COMPARISON:  Ultrasound 03/12/2013.  CT scan 01/07/2006.  FINDINGS: The liver demonstrates a stable hemangioma in the right hepatic lobe with classic MR findings and no change since CT scan of 2007. No other significant hepatic lesions. There is moderate  intrahepatic biliary dilatation and moderate common bile duct dilatation. The common bile duct measures a maximum of 12 mm and head of the pancreas. This is a stable finding since 2007 and may be due to a distal stricture. No common bile duct stones are identified. The cystic duct is also slightly dilated and tortuous with a low insertion. Numerous gallstones are noted but no obvious cystic duct stones.  The pancreas and main pancreatic duct are normal. The spleen is normal. The adrenal glands and kidneys are normal.  The aorta is normal in caliber. The major branch vessels are normal. No mesenteric or retroperitoneal mass or adenopathy.  IMPRESSION: 1. 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 No common bile duct stones are identified. 2. Chololithiasis. 3. Stable right hepatic lobe hemangioma.   Electronically Signed   By: Loralie Champagne M.D.   On: 03/13/2013 11:12    Microbiology: No results found for this or any previous visit (from the past 240 hour(s)).   Labs: Basic Metabolic Panel:  Recent Labs Lab 03/12/13 0600 03/12/13 1248 03/13/13 0400 03/14/13 0545 03/15/13 0654 03/16/13 0811  NA 131*  --  139 137 137 142  K 3.3*  --  3.6 3.3* 3.5 2.9*  CL 95*  --  105 102 104 105  CO2 24  --  20 16* 20 27  GLUCOSE 110*  --  77 72 114* 116*  BUN 7  --  6 4* 5* <3*  CREATININE 0.55  --  0.57 0.53 0.51 0.46*  CALCIUM 9.0  --  8.3* 8.4 8.9 8.9  MG  --  2.0  --   --   --   --   PHOS  --  2.6  --   --   --   --    Liver Function Tests:  Recent Labs Lab 03/12/13 0600 03/12/13 1248 03/13/13 0400 03/14/13 0545 03/15/13 0654 03/16/13 0811  AST 331*  --  183* 166* 150* 112*  ALT 459*  --  318* 293* 297* 232*  ALKPHOS 165*  --  151* 186* 198* 152*  BILITOT 5.3* 5.2* 6.0* 7.4* 2.8* 1.7*  PROT 7.6  --  6.4 6.8 7.5 6.8  ALBUMIN 3.8  --  3.2* 3.2* 3.6 3.2*    Recent Labs Lab 03/12/13 0600 03/13/13 0400 03/14/13 0545  03/15/13 0654 03/16/13 0811  LIPASE 1317* 1152* 446* 184* 159*   No results found for this basename: AMMONIA,  in the last 168 hours CBC:  Recent Labs Lab 03/12/13 0600 03/13/13 0400 03/14/13 0545 03/15/13 0654  WBC 6.1 5.1 5.3 5.9  NEUTROABS 5.0  --   --   --   HGB 14.7 12.7 13.1 14.2  HCT 41.5 37.5 37.6 39.3  MCV 89.1 91.0 89.5 88.1  PLT 254 219 247 293   Cardiac Enzymes: No results found for this basename: CKTOTAL, CKMB, CKMBINDEX, TROPONINI,  in the last 168 hours BNP: BNP (last 3 results) No results found for this basename: PROBNP,  in the last 8760 hours CBG: No results found for this basename: GLUCAP,  in the last 168 hours     Signed:  Edsel Petrin  [facility] 03/16/2013, 5:33 PM

## 2013-03-16 NOTE — Progress Notes (Signed)
DC instructions gone over with patient, questions answered, verbalized understanding.  Rx for protonix given.  Also gave packet with copies of radiology imaging done during hospitalization and lab work results.  Patient walked to front of hospital accompanied by friends for transport home.

## 2013-03-16 NOTE — Progress Notes (Signed)
General Surgery [facility]  Results of CA19-9 and cytopathology reviewed and discussed with Dr. Bosie Clos.  GI consultants want to place stent and defer EUS until pancreatitis resolves.  Will hold off on cholecystectomy until GI work-up completed.  Patient will need cholecystectomy at some point if neoplasm ruled out.  Will follow either in-patient or out-patient for future surgical needs.  Velora Heckler, MD, [facility] Office: [phone]

## 2013-03-16 NOTE — Progress Notes (Addendum)
Patient ID: Zoe Jackson, female   DOB: [birth year], 67 y.o.   MRN: [id number] [facility] Progress Note  Zoe Jackson 67 y.o. 1945/04/17   Subjective: Feels ok. No complaints. Tolerating liquids.  Objective: Vital signs in last 24 hours: Filed Vitals:   03/16/13 0519  BP: 144/77  Pulse: 96  Temp: 98.1 F (36.7 C)  Resp: 20    Physical Exam: Gen: alert, no acute distress Abd: minimal epigastric tenderness without guarding, soft, nondistended, +BS  Lab Results:  Recent Labs  03/15/13 0654 03/16/13 0811  NA 137 142  K 3.5 2.9*  CL 104 105  CO2 20 27  GLUCOSE 114* 116*  BUN 5* <3*  CREATININE 0.51 0.46*  CALCIUM 8.9 8.9    Recent Labs  03/15/13 0654 03/16/13 0811  AST 150* 112*  ALT 297* 232*  ALKPHOS 198* 152*  BILITOT 2.8* 1.7*  PROT 7.5 6.8  ALBUMIN 3.6 3.2*    Recent Labs  03/14/13 0545 03/15/13 0654  WBC 5.3 5.9  HGB 13.1 14.2  HCT 37.6 39.3  MCV 89.5 88.1  PLT 247 293   No results found for this basename: LABPROT, INR,  in the last 72 hours    Assessment/Plan: 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 Elevated CA 19-9 at 163 with negative biliary brushing. D/W Dr. Dulce Sellar and stent would have to be removed in order to look for any subtle lesions in the bile duct and would not recommend removal of stent at this time since clinically she is better. Will advance her diet and have her see Dr. Dulce Sellar in office next week to decide on timing of EUS for the near future. Ok for pt to go home tomorrow if tolerating diet. D/W surgery.  Patient wants to go home today. If can tolerate low fat diet, then ok from GI standpoint to leave today.   Zoe Jackson C. 03/16/2013, 3:03 PM

## 2013-03-16 NOTE — Progress Notes (Signed)
Patient ID: Zoe Jackson, female   DOB: 12/08/1945, 67 y.o.   MRN: [id number] 2 Days Post-Op  Subjective: Pt feels well today.  She was hopeful to get her gallbladder out today.  Objective: Vital signs in last 24 hours: Temp:  [97.8 F (36.6 C)-98.1 F (36.7 C)] 98.1 F (36.7 C) (12/09 0519) Pulse Rate:  [76-96] 96 (12/09 0519) Resp:  [18-20] 20 (12/09 0519) BP: (144-161)/(74-81) 144/77 mmHg (12/09 0519) SpO2:  [100 %] 100 % (12/09 0519) Last BM Date: 03/15/13  Intake/Output from previous day: 12/08 0701 - 12/09 0700 In: 3920 [P.O.:720; I.V.:3100; IV Piggyback:100] Out: -  Intake/Output this shift:    PE: Abd: soft, NT, ND, +BS  Lab Results:   Recent Labs  03/14/13 0545 03/15/13 0654  WBC 5.3 5.9  HGB 13.1 14.2  HCT 37.6 39.3  PLT 247 293   BMET  Recent Labs  03/15/13 0654 03/16/13 0811  NA 137 142  K 3.5 2.9*  CL 104 105  CO2 20 27  GLUCOSE 114* 116*  BUN 5* <3*  CREATININE 0.51 0.46*  CALCIUM 8.9 8.9   PT/INR No results found for this basename: LABPROT, INR,  in the last 72 hours CMP     Component Value Date/Time   NA 142 03/16/2013 0811   K 2.9* 03/16/2013 0811   CL 105 03/16/2013 0811   CO2 27 03/16/2013 0811   GLUCOSE 116* 03/16/2013 0811   BUN <3* 03/16/2013 0811   CREATININE 0.46* 03/16/2013 0811   CALCIUM 8.9 03/16/2013 0811   PROT 6.8 03/16/2013 0811   ALBUMIN 3.2* 03/16/2013 0811   AST 112* 03/16/2013 0811   ALT 232* 03/16/2013 0811   ALKPHOS 152* 03/16/2013 0811   BILITOT 1.7* 03/16/2013 0811   GFRNONAA >90 03/16/2013 0811   GFRAA >90 03/16/2013 0811   Lipase     Component Value Date/Time   LIPASE 159* 03/16/2013 0811       Studies/Results: Dg Ercp Biliary & Pancreatic Ducts  03/14/2013   CLINICAL DATA:  Cholelithiasis. Chronic biliary obstruction fell most likely due to a distal common duct stricture at MRCP yesterday.  EXAM: ERCP with sphincterotomy and stent placement  TECHNIQUE: Multiple spot images obtained with the fluoroscopic  device and submitted for interpretation post-procedure.  COMPARISON:  MRCP dated 03/13/2013 and abdomen ultrasound dated 03/12/2013.  FINDINGS: Endoscope in place with contrast opacification of a dilated common duct and intrahepatic ducts. No intraductal filling defects are seen. There is tapering of the distal common duct with subsequent placement of a stent across the area of tapering.  IMPRESSION: Biliary dilatation, most likely due to a distal common duct structure, with placement of a bridging stent.  These images were submitted for radiologic interpretation only. Please see the procedural report for the amount of contrast and the fluoroscopy time utilized.   Electronically Signed   By: Gordan Payment M.D.   On: 03/14/2013 19:02    Anti-infectives: Anti-infectives   Start     Dose/Rate Route Frequency Ordered Stop   03/13/13 1200  ampicillin-sulbactam (UNASYN) 1.5 g in sodium chloride 0.9 % 50 mL IVPB     1.5 g 100 mL/hr over 30 Minutes Intravenous Every 6 hours 03/13/13 1122         Assessment/Plan  1. Pancreatitis  Gallstones  Dilated intra/extrahepatic biliary ducts  Biliary obstruction ampula stricture POD #1 s/p ERCP with brushings, ampula stent placement  Hyperbilirubinemia  Transaminitis  H/o ovarian cancer   Plan: 1. Her brushings are negative;  however, he CA 19-9 is elevated.  This can be elevated due to inflammation; however we agree with GI that proceeding with and EUS prior to lap chole is in the patient's best interest.  This has been thoroughly discussed with the patient.  She is getting ahead of herself with concerns she has cancer and the treatment for that.  I tried to reassure her that hopefully all will be negative and we can proceed with lap chole, but if not then we could further discuss her option. 2. If EUS negative, then we can proceed with lap chole after.  LOS: 4 days    Debrina Kizer E 03/16/2013, 10:18 AM Pager: [phone]

## 2013-03-16 NOTE — Progress Notes (Deleted)
[facility]                                                                                Patient Demographics  Zoe Jackson, is a 67 y.o. female, DOB - 07-Aug-1945, ZOX:[id number]  Admit date - 03/12/2013   Admitting Physician Barrie Folk, MD  Outpatient Primary MD for the patient is [name], MD  LOS - 4   Chief Complaint  Patient presents with  . Abdominal Pain      Interim history This is a 67 year old female history of ovarian cancer status post hysterectomy and bilateral salpingo-oophorectomy, SBO with surgical lysis, cholelithiasis diagnosed in 2005 minutes presents emergency department with complaints of right upper quadrant pain as well as nausea and vomiting. Abdominal ultrasound demonstrated positive intra-and extrahepatic biliary and CBD dilation. Patient was noted to have elevated bilirubin, LFTs as well as lipase. She was admitted for gallstone pancreatitis. Gastroenterology as well as surgery were consulted.  Patient underwent MRCP as well as ERCP with stent placement.  Currently pending possible laparoscopic cholecystectomy.  Assessment & Plan  Principal Problem:   Pancreatitis, acute Active Problems:   Pancreatitis   Hypokalemia   Hyponatremia   Transaminitis   Cholelithiasis   GERD (gastroesophageal reflux disease)  Intractable abdominal pain and nausea likely secondary to cholelithiasis with possible gallstone pancreatitis -LFTs, Lipase, total bilirubin trending downward  -GI and surgery consulted -MRCP: Stable/chronic intra and extrahepatic biliary dilatation likely  due to distal common bile duct stricture. No common bile duct stones  are identified. -ERCP done 12/7 with stent placment -Will continue pain control, IVF, Unasyn -CA19-9 163.8 -? Laparoscopic cholecystectomy  Hypokalemia -K 2.8, will replete and will continue to monitor -Likely secondary dehydration and poor intake  Hyponatremia -Likely secondary dehydration and poor  intake -Improving, Na 142, will continue to monitor -Continue IVF, however will decrease  Transaminitis -Will continue to monitor. Trending downward -Concern for common bile duct stone -Plan as above   GERD -Continue Protonix   Code Status: Full  Family Communication: None at this time  Disposition Plan: Admitted.  Pending further surgical intervention.  Procedures  MRCP ERCP with stent placement  Consults   General surgery Gastroenterology, Dr. Matthias Hughs  DVT Prophylaxis  Heparin   Lab Results  Component Value Date   PLT 293 03/15/2013    Medications  Scheduled Meds: . ampicillin-sulbactam (UNASYN) IV  1.5 g Intravenous Q6H  . heparin  5,000 Units Subcutaneous Q8H  . pantoprazole (PROTONIX) IV  40 mg Intravenous Q12H  . potassium chloride  40 mEq Oral BID   Continuous Infusions: . sodium chloride 200 mL/hr at 03/16/13 0839  . sodium chloride     PRN Meds:.acetaminophen, acetaminophen, morphine injection, ondansetron (ZOFRAN) IV, ondansetron  Antibiotics    Anti-infectives   Start     Dose/Rate Route Frequency Ordered Stop   03/13/13 1200  ampicillin-sulbactam (UNASYN) 1.5 g in sodium chloride 0.9 % 50 mL IVPB     1.5 g 100 mL/hr over 30 Minutes Intravenous Every 6 hours 03/13/13 1122         Time Spent in minutes   25 minutes   Felecia Stanfill D.O. on 03/16/2013 at 11:21 AM  Between 7am to 7pm - Pager - [phone]  After 7pm go to [email or web address] - password [email or web address]  And look for the night coverage person covering for me after hours  [facility] Office  [phone]    Subjective:   [name] seen and examined today.  Patient states abdominal pain has improved.  She is feeling much better but wishes to have her gallbladder taken out soon.  Patient denies dizziness, chest pain, shortness of breath, new weakness, numbess, tingling.    Objective:   Filed Vitals:   03/15/13 0646 03/15/13 1437 03/15/13 2133 03/16/13 0519  BP:  147/81 161/81 148/74 144/77  Pulse: 77 76 79 96  Temp: 97.8 F (36.6 C) 98 F (36.7 C) 97.8 F (36.6 C) 98.1 F (36.7 C)  TempSrc:  Oral Oral Oral  Resp: 18 18 18 20   Height:      Weight:      SpO2: 99% 100% 100% 100%    Wt Readings from Last 3 Encounters:  03/12/13 61.236 kg (135 lb)  03/12/13 61.236 kg (135 lb)     Intake/Output Summary (Last 24 hours) at 03/16/13 1121 Last data filed at 03/16/13 0900  Gross per 24 hour  Intake   3810 ml  Output      0 ml  Net   3810 ml    Exam  General: Well developed, well nourished, NAD, appears stated age  HEENT: NCAT,  Mildly icteic Sclera, mucous membranes moist.   Neck: Supple, no JVD, no masses  Cardiovascular: S1 S2 auscultated, no rubs, murmurs or gallops. Regular rate and rhythm.  Respiratory: Clear to auscultation bilaterally with equal chest rise  Abdomen: Soft, tender to RUQ palpation, nondistended, + bowel sounds  Extremities: warm dry without cyanosis clubbing or edema  Neuro: AAOx3, cranial nerves grossly intact.   Skin: Without rashes exudates or nodules  Psych: Normal affect and demeanor with intact judgement and insight, anxious  Data Review   Micro Results No results found for this or any previous visit (from the past 240 hour(s)).  Radiology Reports US Abdomen Complete  03/12/2013   CLINICAL DATA:  Abdominal pain, abnormal LFTs and lipase. Concern for choledocholithiasis or gallstone pancreatitis.  EXAM: ULTRASOUND ABDOMEN COMPLETE  COMPARISON:  CT abdomen / pelvis 01/07/2006  FINDINGS: Gallbladder:  Mobile echogenic shadowing foci within the gallbladder lumen consistent with cholelithiasis. No gallbladder wall thickening or pericholecystic fluid. Per the sonographer, the sonographic Eulah Pont sign was negative. A small echogenic focus is identified in the neck of the gallbladder, or proximal cystic duct. This is unlikely to be obstructing given the other findings described above.  Common bile duct:   Diameter: Dilated at 13 mm. Common bile duct is well seen to the upper pancreatic head. No choledocholithiasis. The most distal common bile duct is not seen.  Liver:  Mild intrahepatic biliary ductal dilatation. Fairly well defined 2 cm hyperechoic area in the posterior right hepatic lobe corresponds with a hemangioma seen on a prior CT scan. No additional hepatic lesion identified.  IVC:  No abnormality visualized.  Pancreas:  Pancreatic head is a very mildly hypoechoic which is nonspecific but can be seen with edema. No focal mass. Minimally prominent pancreatic duct at 2.4 mm.  Spleen:  Size and appearance within normal limits.  Right Kidney:  Length: 11.9 cm. Echogenicity within normal limits. No mass or hydronephrosis visualized.  Left Kidney:  Length: 12.5 cm. Echogenicity within normal limits. No mass or hydronephrosis visualized.  Abdominal aorta:  No aneurysm  visualized.  Other findings:  None.  IMPRESSION: 1. Intra and extrahepatic biliary ductal dilatation in the setting of cholelithiasis is concerning for distal choledocholithiasis versus a distal biliary stricture. A pancreatic or ampullary mass is also a possibility. Recommend further evaluation with MRCP or ERCP. 2. Cholelithiasis without sonographic features of cholecystitis. 3. Mild hypo echogenicity of the pancreatic head as can be seen with edema in the setting of pancreatitis. 4. Small benign hemangioma in the right hepatic lobe.   Electronically Signed   By: Malachy Moan M.D.   On: 03/12/2013 10:36   Mr 3d Recon At Scanner  03/13/2013   CLINICAL DATA:  Right upper quadrant abdominal pain. Chololithiasis.  EXAM: MRI ABDOMEN WITHOUT AND WITH CONTRAST (INCLUDING MRCP)  TECHNIQUE: Multiplanar multisequence MR imaging of the abdomen was performed both before and after the administration of intravenous contrast. Heavily T2-weighted images of the biliary and pancreatic ducts were obtained, and three-dimensional MRCP images were rendered by post  processing.  CONTRAST:  13mL MULTIHANCE GADOBENATE DIMEGLUMINE 529 MG/ML IV SOLN  COMPARISON:  Ultrasound 03/12/2013.  CT scan 01/07/2006.  FINDINGS: The liver demonstrates a stable hemangioma in the right hepatic lobe with classic MR findings and no change since CT scan of 2007. No other significant hepatic lesions. There is moderate intrahepatic biliary dilatation and moderate common bile duct dilatation. The common bile duct measures a maximum of 12 mm and head of the pancreas. This is a stable finding since 2007 and may be due to a distal stricture. No common bile duct stones are identified. The cystic duct is also slightly dilated and tortuous with a low insertion. Numerous gallstones are noted but no obvious cystic duct stones.  The pancreas and main pancreatic duct are normal. The spleen is normal. The adrenal glands and kidneys are normal.  The aorta is normal in caliber. The major branch vessels are normal. No mesenteric or retroperitoneal mass or adenopathy.  IMPRESSION: 1. 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 No common bile duct stones are identified. 2. Chololithiasis. 3. Stable right hepatic lobe hemangioma.   Electronically Signed   By: Loralie Champagne M.D.   On: 03/13/2013 11:12   Mr Abd W/wo Cm/mrcp  03/13/2013   CLINICAL DATA:  Right upper quadrant abdominal pain. Chololithiasis.  EXAM: MRI ABDOMEN WITHOUT AND WITH CONTRAST (INCLUDING MRCP)  TECHNIQUE: Multiplanar multisequence MR imaging of the abdomen was performed both before and after the administration of intravenous contrast. Heavily T2-weighted images of the biliary and pancreatic ducts were obtained, and three-dimensional MRCP images were rendered by post processing.  CONTRAST:  13mL MULTIHANCE GADOBENATE DIMEGLUMINE 529 MG/ML IV SOLN  COMPARISON:  Ultrasound 03/12/2013.  CT scan 01/07/2006.  FINDINGS: The liver demonstrates a stable hemangioma in the right hepatic lobe  with classic MR findings and no change since CT scan of 2007. No other significant hepatic lesions. There is moderate intrahepatic biliary dilatation and moderate common bile duct dilatation. The common bile duct measures a maximum of 12 mm and head of the pancreas. This is a stable finding since 2007 and may be due to a distal stricture. No common bile duct stones are identified. The cystic duct is also slightly dilated and tortuous with a low insertion. Numerous gallstones are noted but no obvious cystic duct stones.  The pancreas and main pancreatic duct are normal. The spleen is normal. The adrenal glands and kidneys are normal.  The aorta is normal in caliber. The major branch  vessels are normal. No mesenteric or retroperitoneal mass or adenopathy.  IMPRESSION: 1. 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 No common bile duct stones are identified. 2. Chololithiasis. 3. Stable right hepatic lobe hemangioma.   Electronically Signed   By: Loralie Champagne M.D.   On: 03/13/2013 11:12    CBC  Recent Labs Lab 03/12/13 0600 03/13/13 0400 03/14/13 0545 03/15/13 0654  WBC 6.1 5.1 5.3 5.9  HGB 14.7 12.7 13.1 14.2  HCT 41.5 37.5 37.6 39.3  PLT 254 219 247 293  MCV 89.1 91.0 89.5 88.1  MCH 31.5 30.8 31.2 31.8  MCHC 35.4 33.9 34.8 36.1*  RDW 13.0 13.3 13.3 13.5  LYMPHSABS 0.7  --   --   --   MONOABS 0.4  --   --   --   EOSABS 0.0  --   --   --   BASOSABS 0.0  --   --   --     Chemistries   Recent Labs Lab 03/12/13 0600 03/12/13 1248 03/13/13 0400 03/14/13 0545 03/15/13 0654 03/16/13 0811  NA 131*  --  139 137 137 142  K 3.3*  --  3.6 3.3* 3.5 2.9*  CL 95*  --  105 102 104 105  CO2 24  --  20 16* 20 27  GLUCOSE 110*  --  77 72 114* 116*  BUN 7  --  6 4* 5* <3*  CREATININE 0.55  --  0.57 0.53 0.51 0.46*  CALCIUM 9.0  --  8.3* 8.4 8.9 8.9  MG  --  2.0  --   --   --   --   AST 331*  --  183* 166* 150* 112*  ALT 459*  --   318* 293* 297* 232*  ALKPHOS 165*  --  151* 186* 198* 152*  BILITOT 5.3* 5.2* 6.0* 7.4* 2.8* 1.7*   ------------------------------------------------------------------------------------------------------------------ estimated creatinine clearance is 64.8 ml/min (by C-G formula based on Cr of 0.46). ------------------------------------------------------------------------------------------------------------------ No results found for this basename: HGBA1C,  in the last 72 hours ------------------------------------------------------------------------------------------------------------------ No results found for this basename: CHOL, HDL, LDLCALC, TRIG, CHOLHDL, LDLDIRECT,  in the last 72 hours ------------------------------------------------------------------------------------------------------------------ No results found for this basename: TSH, T4TOTAL, FREET3, T3FREE, THYROIDAB,  in the last 72 hours ------------------------------------------------------------------------------------------------------------------ No results found for this basename: VITAMINB12, FOLATE, FERRITIN, TIBC, IRON, RETICCTPCT,  in the last 72 hours  Coagulation profile No results found for this basename: INR, PROTIME,  in the last 168 hours  No results found for this basename: DDIMER,  in the last 72 hours  Cardiac Enzymes No results found for this basename: CK, CKMB, TROPONINI, MYOGLOBIN,  in the last 168 hours ------------------------------------------------------------------------------------------------------------------ No components found with this basename: POCBNP,

## 2013-03-16 NOTE — Progress Notes (Signed)
Patient ID: Zoe Jackson, female   DOB: [birth year], 67 y.o.   MRN: [id number]  CA19-9 elevated. Bile duct brushing negative. Will d/w Dr. Dulce Sellar and see if EUS can be done tomorrow. Will see patient today and discuss further.

## 2013-08-17 ENCOUNTER — Emergency Department (HOSPITAL_COMMUNITY)
Admission: EM | Admit: 2013-08-17 | Discharge: 2013-08-17 | Disposition: A | Discharge status: Home / Self Care | Payer: No Typology Code available for payment source | Attending: Emergency Medicine | Admitting: Emergency Medicine

## 2013-08-17 ENCOUNTER — Encounter (HOSPITAL_COMMUNITY): Payer: Self-pay | Admitting: Emergency Medicine

## 2013-08-17 DIAGNOSIS — S199XXA Unspecified injury of neck, initial encounter: Secondary | ICD-10-CM

## 2013-08-17 DIAGNOSIS — M25511 Pain in right shoulder: Secondary | ICD-10-CM

## 2013-08-17 DIAGNOSIS — Y9389 Activity, other specified: Secondary | ICD-10-CM | POA: Insufficient documentation

## 2013-08-17 DIAGNOSIS — S0993XA Unspecified injury of face, initial encounter: Secondary | ICD-10-CM | POA: Insufficient documentation

## 2013-08-17 DIAGNOSIS — S40019A Contusion of unspecified shoulder, initial encounter: Secondary | ICD-10-CM | POA: Insufficient documentation

## 2013-08-17 DIAGNOSIS — Y92410 Unspecified street and highway as the place of occurrence of the external cause: Secondary | ICD-10-CM | POA: Insufficient documentation

## 2013-08-17 DIAGNOSIS — Z8719 Personal history of other diseases of the digestive system: Secondary | ICD-10-CM | POA: Insufficient documentation

## 2013-08-17 DIAGNOSIS — Z8543 Personal history of malignant neoplasm of ovary: Secondary | ICD-10-CM | POA: Insufficient documentation

## 2013-08-17 MED ORDER — IBUPROFEN 600 MG PO TABS: 600.0000 mg | ORAL_TABLET | Freq: Four times a day (QID) | ORAL | Status: DC | PRN

## 2013-08-17 MED ORDER — IBUPROFEN 800 MG PO TABS
800.0000 mg | ORAL_TABLET | Freq: Once | ORAL | Status: AC
  Administered 2013-08-17: 800 mg via ORAL
  Filled 2013-08-17: qty 1
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
## 2013-08-17 NOTE — Discharge Instructions (Signed)
Apply ice to the area. It may get more swollen tomorrow.   Take motrin as prescribed for pain.   Follow up with your doctor.   Return to ER if you have severe pain, vomiting, chest pain, abdominal pain.
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
## 2013-11-01 ENCOUNTER — Other Ambulatory Visit: Payer: Self-pay

## 2013-11-01 DIAGNOSIS — Z1231 Encounter for screening mammogram for malignant neoplasm of breast: Secondary | ICD-10-CM

## 2013-11-02 ENCOUNTER — Ambulatory Visit
Admission: RE | Admit: 2013-11-02 | Discharge: 2013-11-02 | Disposition: A | Discharge status: Home / Self Care | Payer: Medicare Other | Source: Ambulatory Visit

## 2013-11-02 DIAGNOSIS — Z1231 Encounter for screening mammogram for malignant neoplasm of breast: Secondary | ICD-10-CM

## 2013-12-17 ENCOUNTER — Other Ambulatory Visit: Payer: Self-pay | Admitting: Gastroenterology

## 2014-05-17 ENCOUNTER — Telehealth: Payer: Self-pay | Admitting: Cardiology

## 2014-05-17 NOTE — Telephone Encounter (Signed)
Close encounter 

## 2014-05-18 ENCOUNTER — Telehealth: Payer: Self-pay | Admitting: Cardiology

## 2014-05-18 NOTE — Telephone Encounter (Signed)
Received records from [place] at [facility] (Dr Harlan Stains) for appointment on 05/25/14 with Dr Percival Spanish.  Records given to [facility] (medical records) for Dr Hochrein's schedule on 05/25/14.  lp

## 2014-05-25 ENCOUNTER — Encounter: Payer: Self-pay | Admitting: Cardiology

## 2014-05-25 ENCOUNTER — Ambulatory Visit (INDEPENDENT_AMBULATORY_CARE_PROVIDER_SITE_OTHER): Payer: Medicare Other | Admitting: Cardiology

## 2014-05-25 VITALS — BP 152/90 | HR 68 | Ht 66.0 in | Wt 143.0 lb

## 2014-05-25 DIAGNOSIS — R0789 Other chest pain: Secondary | ICD-10-CM

## 2014-05-25 DIAGNOSIS — I341 Nonrheumatic mitral (valve) prolapse: Secondary | ICD-10-CM

## 2014-05-25 NOTE — Patient Instructions (Signed)
Your physician recommends that you schedule a follow-up appointment in: as needed with Dr. Percival Spanish  We are ordering a stress test for you to get done  We are ordering an echo for you to get done

## 2014-05-25 NOTE — Progress Notes (Signed)
Cardiology Office Note   Date:  05/25/2014   ID:  Zoe Jackson, DOB 03-14-1946, MRN [id number]  PCP:  Vidal Schwalbe, MD  Cardiologist:   Minus Breeding, MD   No chief complaint on file.     History of Present Illness: Zoe Jackson is a 69 y.o. female who presents for evaluation of hypertension, risk factors and probable mitral valve prolapse. She was told years ago that sometimes click. She's had hypertension that has been borderline but controlled with lifestyle. She rarely takes lisinopril for blood pressure is up. Her brother did die recently of a myocardial infarction at age 43. She has not had any cardiovascular complaints. She is active in housekeeping. With this she denies any cardiovascular symptoms other than rare vague chest pain sporadically. She denies any neck or arm discomfort. She has no palpitations, presyncope or syncope. She has no weight gain or edema. Denies any shortness of breath.   Past Medical History  Diagnosis Date  . SBO (small bowel obstruction)   . Ovarian cancer     Past Surgical History  Procedure Laterality Date  . Abdominal hysterectomy  40 yrs ago    TAH  . Abdominal exploration surgery  2007    LOA  . Ercp N/A 03/14/2013    Procedure: ENDOSCOPIC RETROGRADE CHOLANGIOPANCREATOGRAPHY (ERCP);  Surgeon: Missy Sabins, MD;  Location: [facility] ENDOSCOPY;  Service: Endoscopy;  Laterality: N/A;  . Cholecystectomy       Current Outpatient Prescriptions  Medication Sig Dispense Refill  . B Complex-C-E-Zn (B COMPLEX-C-E-ZINC) tablet Take 1 tablet by mouth daily.    . Coenzyme Q10 (CO Q 10 PO) Take 1 tablet by mouth daily.    Marland Kitchen HAWTHORN PO Take 400 mg by mouth.    Marland Kitchen ibuprofen (ADVIL,MOTRIN) 600 MG tablet Take 1 tablet (600 mg total) by mouth every 6 (six) hours as needed. 30 tablet 0  . Magnesium 300 MG CAPS Take by mouth.    Marland Kitchen OVER THE COUNTER MEDICATION Take 1 each by mouth daily.    Marland Kitchen VITAMIN D, ERGOCALCIFEROL, PO Take 1 tablet by mouth.       No current facility-administered medications for this visit.    Allergies:   Azithromycin; Other; and Sulfa antibiotics    Social History:  The patient  reports that she has never smoked. She does not have any smokeless tobacco history on file. She reports that she does not drink alcohol or use illicit drugs.   Family History:  The patient's family history includes CAD (age of onset: 94) in her brother; Dementia in her mother; Hypertension in her father and mother; Parkinson's disease in her father.    ROS:  Please see the history of present illness.   Otherwise, review of systems are positive for none.   All other systems are reviewed and negative.    PHYSICAL EXAM: VS:  BP 152/90 mmHg  Pulse 68  Ht 5\' 6"  (1.676 m)  Wt 143 lb (64.864 kg)  BMI 23.09 kg/m2 , BMI Body mass index is 23.09 kg/(m^2). GEN: Well nourished, well developed, in no acute distress HEENT: normal Neck: no JVD, carotid bruits, or masses Cardiac: RRR; no murmurs, rubs, or gallops,no edema , positive midsystolic click without murmur Respiratory:  clear to auscultation bilaterally, normal work of breathing GI: soft, nontender, nondistended, + BS MS: no deformity or atrophy Skin: warm and dry, no rash Neuro:  Strength and sensation are intact Psych: euthymic mood, full affect   EKG:  EKG is ordered today. The ekg ordered today demonstrates sinus rhythm, rate 68, leftward axis, poor anterior R-wave progression, cannot exclude old anteroseptal infarct, nonspecific T-wave flattening.   Recent Labs: No results found for requested labs within last 365 days.    Lipid Panel    Component Value Date/Time   CHOL 195 03/12/2013 1248   TRIG 52 03/12/2013 1248   HDL 82 03/12/2013 1248   CHOLHDL 2.4 03/12/2013 1248   VLDL 10 03/12/2013 1248   LDLCALC 103* 03/12/2013 1248      Wt Readings from Last 3 Encounters:  05/25/14 143 lb (64.864 kg)  03/12/13 135 lb (61.236 kg)      Other studies  Reviewed: Additional studies/ records that were reviewed today include: Outside office records and BP diary. Review of the above records demonstrates: As above   ASSESSMENT AND PLAN:  MVP:  She does have a click. I will check an echocardiogram. This will be followed clinically.  HTN:  I reviewed the blood pressure diary. This can be managed as she is doing with lifestyle changes. I don't think any change in medications is indicated.  CHEST PAIN:  She does have a family history of early onset coronary disease. Given her risk factors screening is indicated. She will have a POET (Plain Old Exercise Treadmill)  Current medicines are reviewed at length with the patient today.  The patient does not have concerns regarding medicines.  The following changes have been made:  no change  Labs/ tests ordered today include: POET (Plain Old Exercise Treadmill)  Orders Placed This Encounter  Procedures  . EKG 12-Lead     Disposition:   FU with me as needed.   Signed, Minus Breeding, MD  05/25/2014 12:48 PM    [facility]   A.m. This is much of chest pain

## 2014-05-26 ENCOUNTER — Telehealth (HOSPITAL_COMMUNITY): Payer: Self-pay | Admitting: *Deleted

## 2014-06-17 ENCOUNTER — Telehealth (HOSPITAL_COMMUNITY): Payer: Self-pay

## 2014-06-17 NOTE — Telephone Encounter (Signed)
Encounter complete. 

## 2014-06-22 ENCOUNTER — Ambulatory Visit (HOSPITAL_BASED_OUTPATIENT_CLINIC_OR_DEPARTMENT_OTHER)
Admission: RE | Admit: 2014-06-22 | Discharge: 2014-06-22 | Disposition: A | Discharge status: Home / Self Care | Payer: Medicare Other | Source: Ambulatory Visit | Attending: Cardiology | Admitting: Cardiology

## 2014-06-22 ENCOUNTER — Ambulatory Visit (HOSPITAL_COMMUNITY)
Admission: RE | Admit: 2014-06-22 | Discharge: 2014-06-22 | Disposition: A | Discharge status: Home / Self Care | Payer: Medicare Other | Source: Ambulatory Visit | Attending: Cardiology | Admitting: Cardiology

## 2014-06-22 DIAGNOSIS — R0789 Other chest pain: Secondary | ICD-10-CM | POA: Diagnosis not present

## 2014-06-22 DIAGNOSIS — I341 Nonrheumatic mitral (valve) prolapse: Secondary | ICD-10-CM | POA: Diagnosis not present

## 2014-06-22 DIAGNOSIS — R079 Chest pain, unspecified: Secondary | ICD-10-CM

## 2014-06-22 NOTE — Progress Notes (Signed)
2D Echo Performed 06/22/2014    Deane Wattenbarger, RCS  

## 2014-06-22 NOTE — Procedures (Signed)
Exercise Treadmill Test   Test  Exercise Tolerance Test Ordering MD: Marijo File, MD  Interpreting MD:   Unique Test No: 1 Treadmill:  1  Indication for ETT: chest pain - rule out ischemia  Contraindication to ETT: No   Stress Modality: exercise - treadmill  Cardiac Imaging Performed: non   Protocol: standard Bruce - maximal  Max BP:  199/101  Max MPHR (bpm):  153 85% MPR (bpm):  130  MPHR obtained (bpm):  164 % MPHR obtained:  107  Reached 85% MPHR (min:sec):  2:55 Total Exercise Time (min-sec):  9:28  Workload in METS:  10.80 Borg Scale:  Reason ETT Terminated:  dyspnea    ST Segment Analysis At Rest: normal ST segments - no evidence of significant ST depression With Exercise: 1-2 mm horizontal to upsloping inferior and lateral ST depression  Other Information Arrhythmia:  PVC's Angina during ETT:  absent (0) Quality of ETT:  diagnostic  ETT Interpretation:  borderline (indeterminate) with non-specific ST changes  Comments: 1-2 mm horizontal to upsloping ST depression inferiorly and laterally - lateral changes persist into recovery No chest pain Good exercise tolerance for age Hypertensive at baseline with normal BP response to exercise Occasional PVC's noted  Recommendations: Additional testing may be helpful given her borderline ST changes.  Pixie Casino, MD, [facility] Attending Cardiologist [place]

## 2014-06-30 ENCOUNTER — Telehealth: Payer: Self-pay | Admitting: *Deleted

## 2014-06-30 NOTE — Telephone Encounter (Signed)
Pt is returning your call in reference to a heart cath. Please call back  Thanks

## 2014-06-30 NOTE — Telephone Encounter (Signed)
-----   Message from Minus Breeding, MD sent at 06/30/2014  2:01 PM EDT ----- Unable to get into [facility].  Please have strips printed.  Call the patient and tell her that I wanted to review the strips myself.

## 2014-07-01 NOTE — Telephone Encounter (Signed)
I left a message for patient that explains that Dr Percival Spanish has been unable to view the full treadmill report, but I will call her as soon as her results are available.

## 2014-07-28 ENCOUNTER — Encounter (HOSPITAL_COMMUNITY): Payer: Medicare Other

## 2014-07-28 ENCOUNTER — Ambulatory Visit (HOSPITAL_COMMUNITY): Payer: Medicare Other

## 2014-10-07 ENCOUNTER — Other Ambulatory Visit: Payer: Self-pay

## 2014-10-07 DIAGNOSIS — Z1231 Encounter for screening mammogram for malignant neoplasm of breast: Secondary | ICD-10-CM

## 2014-11-08 ENCOUNTER — Ambulatory Visit: Payer: Medicare Other

## 2014-11-14 ENCOUNTER — Ambulatory Visit
Admission: RE | Admit: 2014-11-14 | Discharge: 2014-11-14 | Disposition: A | Discharge status: Home / Self Care | Payer: Medicare Other | Source: Ambulatory Visit

## 2014-11-14 DIAGNOSIS — Z1231 Encounter for screening mammogram for malignant neoplasm of breast: Secondary | ICD-10-CM

## 2015-02-21 ENCOUNTER — Ambulatory Visit
Admission: RE | Admit: 2015-02-21 | Discharge: 2015-02-21 | Disposition: A | Discharge status: Home / Self Care | Payer: Medicare Other | Source: Ambulatory Visit | Attending: Family Medicine | Admitting: Family Medicine

## 2015-02-21 ENCOUNTER — Other Ambulatory Visit: Payer: Self-pay | Admitting: Family Medicine

## 2015-02-21 DIAGNOSIS — M79672 Pain in left foot: Secondary | ICD-10-CM

## 2015-05-12 ENCOUNTER — Other Ambulatory Visit: Payer: Self-pay | Admitting: Family Medicine

## 2015-05-12 DIAGNOSIS — M8580 Other specified disorders of bone density and structure, unspecified site: Secondary | ICD-10-CM

## 2015-07-05 ENCOUNTER — Ambulatory Visit
Admission: RE | Admit: 2015-07-05 | Discharge: 2015-07-05 | Disposition: A | Discharge status: Home / Self Care | Payer: Medicare Other | Source: Ambulatory Visit | Attending: Family Medicine | Admitting: Family Medicine

## 2015-07-05 DIAGNOSIS — M8580 Other specified disorders of bone density and structure, unspecified site: Secondary | ICD-10-CM

## 2015-12-12 ENCOUNTER — Other Ambulatory Visit: Payer: Self-pay | Admitting: Family Medicine

## 2015-12-12 DIAGNOSIS — Z1231 Encounter for screening mammogram for malignant neoplasm of breast: Secondary | ICD-10-CM

## 2016-01-03 ENCOUNTER — Ambulatory Visit
Admission: RE | Admit: 2016-01-03 | Discharge: 2016-01-03 | Disposition: A | Discharge status: Home / Self Care | Payer: Medicare Other | Source: Ambulatory Visit | Attending: Family Medicine | Admitting: Family Medicine

## 2016-01-03 DIAGNOSIS — Z1231 Encounter for screening mammogram for malignant neoplasm of breast: Secondary | ICD-10-CM

## 2016-06-18 ENCOUNTER — Ambulatory Visit (INDEPENDENT_AMBULATORY_CARE_PROVIDER_SITE_OTHER): Payer: Medicare Other | Admitting: Physician Assistant

## 2016-06-18 VITALS — BP 138/88 | HR 90 | Temp 98.0°F | Resp 16 | Ht 66.0 in | Wt 155.0 lb

## 2016-06-18 DIAGNOSIS — Z711 Person with feared health complaint in whom no diagnosis is made: Secondary | ICD-10-CM | POA: Diagnosis not present

## 2016-06-18 NOTE — Progress Notes (Signed)
  06/21/2016 9:28 AM   DOB: March 16, 1946 / MRN: [id number]  SUBJECTIVE:  Zoe Jackson is a 71 y.o. female presenting for elevated BP. Tells me her BP was mildly elevated after a stressful week last week.  Also tells me that she had an excessive meal on three days ago at a church function.  She feels well today, and is more worried about her numbers. She takes 1/2 a losartan daily and has been taking the whole tab for the last three days.   She is allergic to azithromycin; other; and sulfa antibiotics.   She  has a past medical history of Ovarian cancer ([place]) and SBO (small bowel obstruction).    She  reports that she has never smoked. She has never used smokeless tobacco. She reports that she does not drink alcohol or use drugs. She  has no sexual activity history on file. The patient  has a past surgical history that includes Abdominal hysterectomy (40 yrs ago); Abdominal exploration surgery (2007); ERCP (N/A, 03/14/2013); and Cholecystectomy.  Her family history includes CAD (age of onset: 67) in her brother; Dementia in her mother; Hypertension in her father and mother; Parkinson's disease in her father.  Review of Systems  Constitutional: Negative for fever.  Cardiovascular: Negative for chest pain, palpitations, orthopnea and leg swelling.  Skin: Negative for rash.  Neurological: Negative for dizziness and headaches.    The problem list and medications were reviewed and updated by myself where necessary and exist elsewhere in the encounter.   OBJECTIVE:  BP 138/88   Pulse 90   Temp 98 F (36.7 C) (Oral)   Resp 16   Ht 5\' 6"  (1.676 m)   Wt 155 lb (70.3 kg)   SpO2 97%   BMI 25.02 kg/m   Physical Exam  Constitutional: She is oriented to person, place, and time. No distress.  Cardiovascular: Normal rate, regular rhythm, normal heart sounds and intact distal pulses.   Pulmonary/Chest: Effort normal and breath sounds normal.  Musculoskeletal: She exhibits no edema.   Neurological: She is alert and oriented to person, place, and time. No cranial nerve deficit.  Skin: Skin is warm and dry. She is not diaphoretic.    Wt Readings from Last 3 Encounters:  06/18/16 155 lb (70.3 kg)  05/25/14 143 lb (64.9 kg)  03/12/13 135 lb (61.2 kg)   Lab Results  Component Value Date   CREATININE 0.46 (L) 03/16/2013   BUN <3 (L) 03/16/2013   NA 142 03/16/2013   K 2.9 (L) 03/16/2013   CL 105 03/16/2013   CO2 27 03/16/2013      No results found for this or any previous visit (from the past 72 hour(s)).  No results found.  ASSESSMENT AND PLAN:  Zoe Jackson was seen today for hypertension.  Diagnoses and all orders for this visit:  Physically well but worried Comments: I reassured her that her blood pressure was not dangerously high that she should see her PCP next available.  Until that time it is okay to double her ARB which is still a low dose. .    The patient is advised to call or return to clinic if she does not see an improvement in symptoms, or to seek the care of the closest emergency department if she worsens with the above plan.   Philis Fendt, MHS, PA-C [facility] 06/21/2016 9:28 AM

## 2016-06-18 NOTE — Patient Instructions (Signed)
     IF you received an x-ray today, you will receive an invoice from [facility]. Please contact [facility] at [phone] with questions or concerns regarding your invoice.   IF you received labwork today, you will receive an invoice from [facility]. Please contact [facility] at [phone] with questions or concerns regarding your invoice.   Our billing staff will not be able to assist you with questions regarding bills from these companies.  You will be contacted with the lab results as soon as they are available. The fastest way to get your results is to activate your My Chart account. Instructions are located on the last page of this paperwork. If you have not heard from us regarding the results in 2 weeks, please contact this office.     

## 2016-10-20 ENCOUNTER — Encounter (HOSPITAL_COMMUNITY): Payer: Self-pay

## 2016-10-20 ENCOUNTER — Emergency Department (HOSPITAL_COMMUNITY)
Admission: EM | Admit: 2016-10-20 | Discharge: 2016-10-20 | Discharge status: Against Medical Advice | Payer: Medicare Other | Attending: Emergency Medicine | Admitting: Emergency Medicine

## 2016-10-20 DIAGNOSIS — Z5321 Procedure and treatment not carried out due to patient leaving prior to being seen by health care provider: Secondary | ICD-10-CM | POA: Insufficient documentation

## 2016-10-20 DIAGNOSIS — I10 Essential (primary) hypertension: Secondary | ICD-10-CM | POA: Diagnosis present

## 2016-10-20 NOTE — ED Notes (Signed)
No respiratory or acute distress noted alert and oriented x 3 no pain voiced clear speech noted moves all extremities.

## 2016-10-20 NOTE — ED Notes (Signed)
Pt states she is leaving again and doesn't want to wait for discharge instructions.

## 2016-10-20 NOTE — ED Provider Notes (Cosign Needed)
[place] DEPT Provider Note   CSN: [id number] Arrival date & time: 10/20/16  0008     History   Chief Complaint Chief Complaint  Patient presents with  . Hypertension    HPI Zoe Jackson is a 71 y.o. female with a hx of everything cancer, small bowel obstruction, mitral valve prolapse, pancreatitis, hypertension presents to the Emergency Department complaining of gradual, persistent, progressively worsening hypertension onset yesterday afternoon. Patient reports that her blood pressure usually runs 140s over 80s and her mild hypertension is treated with 12.5 mg of losartan 1 time per day.  She reports that Friday she had a facelift and has been very anxious about the results. She reports that on Saturday her blood pressure seemed high. At 7 PM on Saturday her blood pressure was in the 170s over 100s. She took a total of 50 mg of losartan at that time. She reports she remained anxious and therefore presented to the emergency department. She denies headache, vision changes, neck pain, fevers, chills, chest pain, shortness of breath, abdominal pain, swelling of her legs. Nothing seems to make her symptoms of hypertension better or worse.   The history is provided by the patient and medical records. No language interpreter was used.    Past Medical History:  Diagnosis Date  . Ovarian cancer ([place])   . SBO (small bowel obstruction) [facility])     Patient Active Problem List   Diagnosis Date Noted  . MVP (mitral valve prolapse) 05/25/2014  . Pancreatitis 03/12/2013  . Pancreatitis, acute 03/12/2013  . Hypokalemia 03/12/2013  . Hyponatremia 03/12/2013  . Transaminitis 03/12/2013  . Cholelithiasis 03/12/2013  . GERD (gastroesophageal reflux disease) 03/12/2013    Past Surgical History:  Procedure Laterality Date  . ABDOMINAL EXPLORATION SURGERY  2007   LOA  . ABDOMINAL HYSTERECTOMY  40 yrs ago   TAH  . CHOLECYSTECTOMY    . ERCP N/A 03/14/2013   Procedure: ENDOSCOPIC  RETROGRADE CHOLANGIOPANCREATOGRAPHY (ERCP);  Surgeon: Missy Sabins, MD;  Location: [facility] ENDOSCOPY;  Service: Endoscopy;  Laterality: N/A;    OB History    No data available       Home Medications    Prior to Admission medications   Medication Sig Start Date End Date Taking? Authorizing Provider  B Complex-C-E-Zn (B COMPLEX-C-E-ZINC) tablet Take 1 tablet by mouth daily.    [provider]  BIOTIN PO Take by mouth.    [provider]  Cholecalciferol (VITAMIN D) 2000 units CAPS Take by mouth.    [provider]  Coenzyme Q10 (CO Q 10 PO) Take 1 tablet by mouth daily.    [provider]  Flaxseed, Linseed, (FLAX SEED OIL PO) Take by mouth.    [provider]  losartan (COZAAR) 25 MG tablet Take 25 mg by mouth daily.    [provider]  Magnesium 300 MG CAPS Take by mouth.    [provider]  Misc Natural Products (LUTEIN 20 PO) Take by mouth.    [provider]  OVER THE COUNTER MEDICATION Take 1 each by mouth daily.    [provider]    Family History Family History  Problem Relation Age of Onset  . Dementia Mother   . Hypertension Mother   . Parkinson's disease Father   . Hypertension Father   . CAD Brother 52       Died of MI 19-Apr-2023    Social History Social History  Substance Use Topics  . Smoking status: Never  Smoker  . Smokeless tobacco: Never Used  . Alcohol use No     Allergies   Azithromycin; Other; and Sulfa antibiotics   Review of Systems Review of Systems  Constitutional: Negative for appetite change, diaphoresis, fatigue, fever and unexpected weight change.  HENT: Negative for mouth sores.   Eyes: Negative for visual disturbance.  Respiratory: Negative for cough, chest tightness, shortness of breath and wheezing.   Cardiovascular: Negative for chest pain.       Hypertension  Gastrointestinal: Negative for abdominal pain, constipation, diarrhea, nausea and vomiting.    Endocrine: Negative for polydipsia, polyphagia and polyuria.  Genitourinary: Negative for dysuria, frequency, hematuria and urgency.  Musculoskeletal: Negative for back pain and neck stiffness.  Skin: Negative for rash.  Allergic/Immunologic: Negative for immunocompromised state.  Neurological: Negative for syncope, light-headedness and headaches.  Hematological: Does not bruise/bleed easily.  Psychiatric/Behavioral: Negative for sleep disturbance. The patient is not nervous/anxious.   All other systems reviewed and are negative.    Physical Exam Updated Vital Signs BP (!) 177/91 (BP Location: Left Arm)   Pulse 88   Temp 97.7 F (36.5 C) (Oral)   Resp (!) 80   Ht 5\' 6"  (1.676 m)   Wt 68 kg (150 lb)   SpO2 99%   BMI 24.21 kg/m   Physical Exam  Constitutional: She is oriented to person, place, and time. She appears well-developed and well-nourished. No distress.  Awake, alert, nontoxic appearance  HENT:  Head: Normocephalic and atraumatic.  Mouth/Throat: Oropharynx is clear and moist. No oropharyngeal exudate.  Eyes: Pupils are equal, round, and reactive to light. Conjunctivae and EOM are normal. No scleral icterus.  No horizontal, vertical or rotational nystagmus  Neck: Normal range of motion. Neck supple.  Full active and passive ROM without pain No midline or paraspinal tenderness No nuchal rigidity or meningeal signs  Cardiovascular: Normal rate, regular rhythm and intact distal pulses.   Pulmonary/Chest: Effort normal and breath sounds normal. No respiratory distress. She has no wheezes. She has no rales.  Equal chest expansion  Abdominal: Soft. Bowel sounds are normal. She exhibits no mass. There is no tenderness. There is no rebound and no guarding.  Musculoskeletal: Normal range of motion. She exhibits no edema.  Lymphadenopathy:    She has no cervical adenopathy.  Neurological: She is alert and oriented to person, place, and time. No cranial nerve deficit. She  exhibits normal muscle tone. Coordination normal.  Mental Status:  Alert, oriented, thought content appropriate. Speech fluent without evidence of aphasia. Able to follow 2 step commands without difficulty.  Cranial Nerves:  II:  Peripheral visual fields grossly normal, pupils equal, round, reactive to light III,IV, VI: ptosis not present, extra-ocular motions intact bilaterally  V,VII: smile symmetric, facial light touch sensation equal VIII: hearing grossly normal bilaterally  IX,X: midline uvula rise  XI: bilateral shoulder shrug equal and strong XII: midline tongue extension  Motor:  5/5 in upper and lower extremities bilaterally including strong and equal grip strength and dorsiflexion/plantar flexion Sensory: Pinprick and light touch normal in all extremities.  Cerebellar: normal finger-to-nose with bilateral upper extremities Gait: normal gait and balance CV: distal pulses palpable throughout   Skin: Skin is warm and dry. No rash noted. She is not diaphoretic.  Psychiatric: She has a normal mood and affect. Her behavior is normal. Judgment and thought content normal.  Nursing note and vitals reviewed.    ED Treatments / Results   Procedures Procedures (including critical care time)  Medications  Ordered in ED Medications - No data to display   Initial Impression / Assessment and Plan / ED Course  I have reviewed the triage vital signs and the nursing notes.  Pertinent labs & imaging results that were available during my care of the patient were reviewed by me and considered in my medical decision making (see chart for details).     Patient presents with less than 24 hours of hypertension. She is hypertensive in the department however she has no clinical signs or symptoms of endorgan damage or hypertensive urgency. Discussed with patient potential for hypertensive urgency. She states she feels fine and is ready to go home. She declines CT of the head and blood work stating  she will see her primary care doctor on Monday. She does not wish for further assessment or treatment. She is well-appearing. Normal neurologic exam. No evidence of CVA. Doubt ACS. Hypertension likely secondary to face lift and anxiety. Discussed reasons to return immediately to the emergency department. Patient states understanding and is in agreement with the plan.  Final Clinical Impressions(s) / ED Diagnoses   Final diagnoses:  Hypertension, unspecified type    New Prescriptions Discharge Medication List as of 10/20/2016  2:41 AM       Caydin Yeatts, Jarrett Soho, PA-C 10/20/16 0251

## 2016-10-20 NOTE — ED Notes (Signed)
Pt states she wants to leave without being seen by ER doctor states she will see her doctor next week for her blood pressure issues.  Asked pt to come back for any worsening symptoms.

## 2016-10-20 NOTE — ED Notes (Signed)
Pt decided to stay now.

## 2016-10-20 NOTE — ED Triage Notes (Addendum)
Pt presents with c/o hypertension. Pt reports she took her BP at home and her BP was 189/103. Pt reports she does take medication for her BP, 1/2 pill of Losartan every day and reports her BP readings are usually controlled with that and walking each day. Pt reports she did have a surgical procedure yesterday and has been unable to walk and has been taking pain medicine so she is unsure as to whether it is related. Pt reports she feels "just fine". Pt reports she took 2 full pills of Losartan tonight in an attempt to decrease her BP and she normally only takes 1/2 a pill.

## 2016-11-22 ENCOUNTER — Other Ambulatory Visit: Payer: Self-pay | Admitting: Family Medicine

## 2016-11-22 DIAGNOSIS — Z1231 Encounter for screening mammogram for malignant neoplasm of breast: Secondary | ICD-10-CM

## 2017-01-10 ENCOUNTER — Ambulatory Visit
Admission: RE | Admit: 2017-01-10 | Discharge: 2017-01-10 | Disposition: A | Discharge status: Home / Self Care | Payer: Medicare Other | Source: Ambulatory Visit | Attending: Family Medicine | Admitting: Family Medicine

## 2017-01-10 DIAGNOSIS — Z1231 Encounter for screening mammogram for malignant neoplasm of breast: Secondary | ICD-10-CM

## 2017-09-05 ENCOUNTER — Telehealth: Payer: Self-pay | Admitting: Cardiology

## 2017-09-05 NOTE — Telephone Encounter (Signed)
Did not need this encounter °

## 2017-09-22 NOTE — Progress Notes (Signed)
Cardiology Office Note   Date:  09/23/2017   ID:  Zoe Jackson, DOB [birth year], MRN [id number]  PCP:  Harlan Stains, MD  Cardiologist:   No primary care provider on file. Referring:  Maury Dus, MD    Chief Complaint  Patient presents with  . Chest Pain     History of Present Illness: Zoe Jackson is a 72 y.o. female who is referred by Maury Dus, MD for evaluation of  SOB  .  I last saw her in March 2016 .  She had an echo with mild MVP and no MR.  She had a POET (Plain Old Exercise Treadmill) for evaluation of chest pain.  She had no ischemia.  There were up sloping ST changes.    She is done well the past 3 years until [facility] when she took a 5-hour hike with a back.  This was overwhelming for her and she did develop chest pressure that evening.  She had some tightness.  She describes some aching through to her back.  She really felt poorly with increased fatigue for about 3 weeks.  She went to an urgent care.  They stopped losartan as they have been a recall although not particularly apparently on her lot.  She actually did not want to start any other blood pressure medicine the Norvasc was suggested.  She eventually had pulmonary function testing which I do not have but she was told these were normal.  She finally started feeling better.  She is back to doing a little bit of walking.  She has not had any more chest discomfort.  Has no resting shortness of breath, PND orthopnea.  Is not having any, presyncope or syncope.  I do note however that she has a change in her EKG as reported below.   Past Medical History:  Diagnosis Date  . MVP (mitral valve prolapse)   . Ovarian cancer ([place])   . SBO (small bowel obstruction) ([facility])     Past Surgical History:  Procedure Laterality Date  . ABDOMINAL EXPLORATION SURGERY  2007   LOA  . ABDOMINAL HYSTERECTOMY  40 yrs ago   TAH  . CHOLECYSTECTOMY    . ERCP N/A 03/14/2013   Procedure: ENDOSCOPIC RETROGRADE  CHOLANGIOPANCREATOGRAPHY (ERCP);  Surgeon: Missy Sabins, MD;  Location: [facility] ENDOSCOPY;  Service: Endoscopy;  Laterality: N/A;     Current Outpatient Medications  Medication Sig Dispense Refill  . B Complex-C-E-Zn (B COMPLEX-C-E-ZINC) tablet Take 1 tablet by mouth daily.    Marland Kitchen BIOTIN PO Take by mouth as directed.    . Cholecalciferol (VITAMIN D) 2000 units CAPS Take by mouth as directed.    . Coenzyme Q10 (CO Q 10 PO) Take 1 tablet by mouth daily.    . Magnesium 300 MG CAPS Take by mouth as directed.    . Misc Natural Products (LUTEIN 20 PO) Take by mouth.    Marland Kitchen OVER THE COUNTER MEDICATION Take 1 each by mouth daily.     No current facility-administered medications for this visit.     Allergies:   Azithromycin; Lisinopril; Other; and Sulfa antibiotics    Social History:  The patient  reports that she has never smoked. She has never used smokeless tobacco. She reports that she does not drink alcohol or use drugs.   Family History:  The patient's family history includes CAD (age of onset: 31) in her brother; Dementia in her mother; Hypertension in her father and mother; Parkinson's disease  in her father.    ROS:  Please see the history of present illness.   Otherwise, review of systems are positive for none.   All other systems are reviewed and negative.    PHYSICAL EXAM: VS:  BP (!) 144/94   Pulse 82   Ht 5\' 6"  (1.676 m)   Wt 151 lb 4 oz (68.6 kg)   SpO2 97%   BMI 24.41 kg/m   , BMI Body mass index is 24.41 kg/m. GENERAL:  Well appearing HEENT:  Pupils equal round and reactive, fundi not visualized, oral mucosa unremarkable NECK:  No jugular venous distention, waveform within normal limits, carotid upstroke brisk and symmetric, no bruits, no thyromegaly LYMPHATICS:  No cervical, inguinal adenopathy LUNGS:  Clear to auscultation bilaterally BACK:  No CVA tenderness CHEST:  Unremarkable HEART:  PMI not displaced or sustained,S1 and S2 within normal limits, no S3, no S4, no  clicks, no rubs, no murmurs ABD:  Flat, positive bowel sounds normal in frequency in pitch, no bruits, no rebound, no guarding, no midline pulsatile mass, no hepatomegaly, no splenomegaly EXT:  2 plus pulses throughout, no edema, no cyanosis no clubbing SKIN:  No rashes no nodules NEURO:  Cranial nerves II through XII grossly intact, motor grossly intact throughout PSYCH:  Cognitively intact, oriented to person place and time    EKG:  EKG is ordered today. The ekg ordered today demonstrates Sinus rhythm, rate 86, premature atrial contractions, poor anterior R wave progression unchanged from previous, inferior and lateral T wave inversions new compared to previous EKG.   Recent Labs: No results found for requested labs within last 8760 hours.    Lipid Panel    Component Value Date/Time   CHOL 195 03/12/2013 1248   TRIG 52 03/12/2013 1248   HDL 82 03/12/2013 1248   CHOLHDL 2.4 03/12/2013 1248   VLDL 10 03/12/2013 1248   LDLCALC 103 (H) 03/12/2013 1248      Wt Readings from Last 3 Encounters:  09/23/17 151 lb 4 oz (68.6 kg)  10/20/16 150 lb (68 kg)  06/18/16 155 lb (70.3 kg)      Other studies Reviewed: Additional studies/ records that were reviewed today include: Old records/EKG. Review of the above records demonstrates:  Please see elsewhere in the note.     ASSESSMENT AND PLAN:  MVP:  I would like to check an echo as below.  I don't actually hear a click today.   CHEST PAIN:  She described chest discomfort as written and does have new T wave inversions.  I am going to start with an echocardiogram.  If this is unremarkable and likely going to schedule a [employer].   If it is abnormal I will either pursue catheterization or coronary CTA.  HTN:  She does not want to take a medicine and I reviewed her BP diary and I think that this is reasonable.    Current medicines are reviewed at length with the patient today.  The patient does not have concerns regarding  medicines.  The following changes have been made:  no change  Labs/ tests ordered today include  Orders Placed This Encounter  Procedures  . EKG 12-Lead  . ECHOCARDIOGRAM COMPLETE     Disposition:   FU with me as needed based on the results above.     Signed, Minus Breeding, MD  09/23/2017 12:37 PM     [facility]

## 2017-09-23 ENCOUNTER — Ambulatory Visit: Payer: Medicare Other | Admitting: Cardiology

## 2017-09-23 ENCOUNTER — Other Ambulatory Visit: Payer: Self-pay

## 2017-09-23 ENCOUNTER — Encounter: Payer: Self-pay | Admitting: Cardiology

## 2017-09-23 ENCOUNTER — Ambulatory Visit (HOSPITAL_COMMUNITY): Payer: Medicare Other | Attending: Cardiology

## 2017-09-23 VITALS — BP 144/94 | HR 82 | Ht 66.0 in | Wt 151.2 lb

## 2017-09-23 DIAGNOSIS — R9431 Abnormal electrocardiogram [ECG] [EKG]: Secondary | ICD-10-CM | POA: Insufficient documentation

## 2017-09-23 DIAGNOSIS — I341 Nonrheumatic mitral (valve) prolapse: Secondary | ICD-10-CM | POA: Diagnosis not present

## 2017-09-23 DIAGNOSIS — I10 Essential (primary) hypertension: Secondary | ICD-10-CM | POA: Insufficient documentation

## 2017-09-23 DIAGNOSIS — R079 Chest pain, unspecified: Secondary | ICD-10-CM

## 2017-09-23 LAB — ECHOCARDIOGRAM COMPLETE
Height: 66 in
WEIGHTICAEL: 2420 [oz_av]

## 2017-09-23 NOTE — Patient Instructions (Addendum)
Medication Instructions:  Continue current medications  If you need a refill on your cardiac medications before your next appointment, please call your pharmacy.  Labwork: None Ordered   Testing/Procedures: Your physician has requested that you have an echocardiogram. Echocardiography is a painless test that uses sound waves to create images of your heart. It provides your doctor with information about the size and shape of your heart and how well your heart's chambers and valves are working. This procedure takes approximately one hour. There are no restrictions for this procedure.   Follow-Up: Your physician wants you to follow-up in: As Needed.     Thank you for choosing [facility] at [facility]!!

## 2017-09-25 ENCOUNTER — Telehealth: Payer: Self-pay

## 2017-09-25 NOTE — Telephone Encounter (Signed)
Noted  

## 2017-09-25 NOTE — Telephone Encounter (Signed)
While discussing pt's results she states that she was discussing previous surgeries with Dr Percival Spanish and she states that she thought of a elective surgery-cosmetic procedure that she had last July 2018 she had a mini facelift that she did not think about. She had no general anesthesia just local anesthesia. She states that her PCP and also the surgeon asked her to stop all of her supplements before this procedure, so she did. She had no problems thru the procedure. A couple hours later she states that her "bp shot up" and she sent to the ER and she states that "they did nothing for her", her Bp at the ER was 177/91 HR 88 she went home and took the natural BP supplement that she usually takes and after awhile her bp went down 130/80'ish. She did not want you to think that she was keeping this from you but, she "just remembered" and thought that she would let you know.  Also, she states "the other day" when she was here and had a "weird" EKG she said to tell you that she had not slept at all the night before and this may be why "it was off" (it appears that her appt was 09-23-17)

## 2017-09-26 ENCOUNTER — Telehealth: Payer: Self-pay | Admitting: Cardiology

## 2017-09-26 NOTE — Telephone Encounter (Signed)
Follow Up:    Pt said she wanted you to know that her blood pressure is now down to 135/81.

## 2017-09-26 NOTE — Telephone Encounter (Signed)
LMTCB

## 2017-09-26 NOTE — Telephone Encounter (Signed)
Returned call to patient of Dr. Percival Spanish who was seen on 6/18. She states she was previously on losartan 12.5mg  and was taken off of it due to concerns that it "affects the lungs" - she has been off this for about 1 month. She reports her BP did well on losartan. She reports her BP was elevated yesterday when Sharyn Lull called her with test results but she did not mention this. She reports she is "exahusted" and "wiped out". She states she ate a fairly large meal at [employer] on Wednesday and woke up the following day with high BP - sodium?. She also states her "natural" supplements are not helping her BP  Prior to 6/20   Her BP was 130s/80s  6/20  157/84 154/87 HR 59 160/94 HR 75 158/93 164/93 (middle of night - took lisinopril 2.5mg  of husband's med)  6/21 153/90  She is concerned about her BP being elevated and would like MD to review her concerns.

## 2017-09-26 NOTE — Telephone Encounter (Signed)
Patient returned call. She is aware MD did not recommended med changes. She states she is aware she has HTN and will proceed w/testing as directed by MD. She thinks her elevated BP was d/t her [place] [employer] meal. She thinks the lisinopril she took last night made her a little SOB. She states she will continue with her "herbal" BP meds

## 2017-09-26 NOTE — Telephone Encounter (Signed)
OK 

## 2017-09-26 NOTE — Telephone Encounter (Signed)
Did not need this encounter °

## 2017-09-26 NOTE — Telephone Encounter (Signed)
Follow up      Pt c/o BP issue:  1. What are your last 5 BP readings? 157/84, 160/94, 158/93 2. Are you having any other symptoms (ex. Dizziness, headache, blurred vision, passed out)? Extremely tired  3. What is your medication issue? none    Patient says she has a salty taste in her mouth as well. She says her BP has been high for 36 hours

## 2017-09-30 ENCOUNTER — Telehealth: Payer: Self-pay | Admitting: Cardiology

## 2017-09-30 DIAGNOSIS — R079 Chest pain, unspecified: Secondary | ICD-10-CM

## 2017-09-30 DIAGNOSIS — R9431 Abnormal electrocardiogram [ECG] [EKG]: Secondary | ICD-10-CM

## 2017-09-30 NOTE — Telephone Encounter (Signed)
Result Notes for ECHOCARDIOGRAM COMPLETE   Notes recorded by Waylan Rocher, LPN on 1/82/9937 at 1:69 PM EDT Pt notified, no further questions  ------  Notes recorded by Waylan Rocher, LPN on 6/78/9381 at 0:17 PM EDT [facility] ------  Notes recorded by Minus Breeding, MD on 09/24/2017 at 9:34 AM EDT The EF was OK. 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 Given the chest pain and EKG changes as discussed in my note I would like to screen her with a Lexiscan Myoview. Call Ms. Hush with the results and send results to Harlan Stains, MD

## 2017-09-30 NOTE — Telephone Encounter (Signed)
New Message   Pt states she was suppose to have a chemical stress test scheduled but hasn't heard anything

## 2017-09-30 NOTE — Telephone Encounter (Signed)
Lexiscan was not previously ordered. Has been ordered in Epic and staff message sent to scheduler

## 2017-10-01 ENCOUNTER — Encounter: Payer: Self-pay | Admitting: Cardiology

## 2017-10-03 NOTE — Telephone Encounter (Signed)
Pt schedule for Lexiscan Myo on 07/09

## 2017-10-08 ENCOUNTER — Telehealth (HOSPITAL_COMMUNITY): Payer: Self-pay

## 2017-10-08 NOTE — Telephone Encounter (Signed)
Encounter complete. 

## 2017-10-14 ENCOUNTER — Ambulatory Visit (HOSPITAL_COMMUNITY)
Admission: RE | Admit: 2017-10-14 | Discharge: 2017-10-14 | Disposition: A | Discharge status: Home / Self Care | Payer: Medicare Other | Source: Ambulatory Visit | Attending: Cardiovascular Disease | Admitting: Cardiovascular Disease

## 2017-10-14 DIAGNOSIS — R079 Chest pain, unspecified: Secondary | ICD-10-CM | POA: Insufficient documentation

## 2017-10-14 DIAGNOSIS — R9431 Abnormal electrocardiogram [ECG] [EKG]: Secondary | ICD-10-CM | POA: Insufficient documentation

## 2017-10-14 LAB — MYOCARDIAL PERFUSION IMAGING
CHL CUP NUCLEAR SRS: 0
CSEPPHR: 122 {beats}/min
LV sys vol: 22 mL
LVDIAVOL: 78 mL (ref 46–106)
Rest HR: 76 {beats}/min
SDS: 5
SSS: 5
TID: 0.9

## 2017-10-14 MED ORDER — REGADENOSON 0.4 MG/5ML IV SOLN
0.4000 mg | Freq: Once | INTRAVENOUS | Status: AC
  Administered 2017-10-14: 0.4 mg via INTRAVENOUS

## 2017-10-14 MED ORDER — TECHNETIUM TC 99M TETROFOSMIN IV KIT
30.8000 | PACK | Freq: Once | INTRAVENOUS | Status: AC | PRN
  Administered 2017-10-14: 30.8 via INTRAVENOUS
  Filled 2017-10-14: qty 31

## 2017-10-14 MED ORDER — TECHNETIUM TC 99M TETROFOSMIN IV KIT
10.5000 | PACK | Freq: Once | INTRAVENOUS | Status: AC | PRN
  Administered 2017-10-14: 10.5 via INTRAVENOUS
  Filled 2017-10-14: qty 11

## 2017-10-17 ENCOUNTER — Telehealth: Payer: Self-pay | Admitting: Cardiology

## 2017-10-17 NOTE — Telephone Encounter (Signed)
New Message    Patient wants to speak to you about possibly being put on anxiety medication, the only time her bp runs high is when she get anxious or stressed.   Also she wants to know about the results again of her test

## 2017-10-17 NOTE — Telephone Encounter (Signed)
Returned call to patient of Dr. Percival Spanish. Reiterated stress test results per request.   Patient states she has stopped using caffeine and her BP has stabilized. She states her BP spikes when she gets anxious. She is wondering if she needs an anti-anxiety medication. She states she tracks her BP 4-5 times today. Advised that twice daily is sufficient. She states her BP averages 130/80. She uses an herbal BP supplement.   She is concerned that her PCP would like her to take a BP medication but she is not sure this is needed. She would like MD opinion

## 2017-10-17 NOTE — Telephone Encounter (Signed)
Patient returned call. She is aware of MD advice that BP is OK. She went to health food store and purchased OTC herbal supplement to help her sleep. She will try this instead of a prescription med for anxiety

## 2017-10-17 NOTE — Telephone Encounter (Signed)
LMTCB

## 2017-10-17 NOTE — Telephone Encounter (Signed)
Looks like her BP is OK. She needs to see her PCP about her anxiety.

## 2018-04-21 ENCOUNTER — Other Ambulatory Visit: Payer: Self-pay | Admitting: Family Medicine

## 2018-04-21 DIAGNOSIS — Z1231 Encounter for screening mammogram for malignant neoplasm of breast: Secondary | ICD-10-CM

## 2018-06-01 DIAGNOSIS — H5213 Myopia, bilateral: Secondary | ICD-10-CM | POA: Diagnosis not present

## 2018-06-01 DIAGNOSIS — H2513 Age-related nuclear cataract, bilateral: Secondary | ICD-10-CM | POA: Diagnosis not present

## 2018-06-08 ENCOUNTER — Ambulatory Visit
Admission: RE | Admit: 2018-06-08 | Discharge: 2018-06-08 | Disposition: A | Discharge status: Home / Self Care | Payer: PPO | Source: Ambulatory Visit | Attending: Family Medicine | Admitting: Family Medicine

## 2018-06-08 DIAGNOSIS — Z1231 Encounter for screening mammogram for malignant neoplasm of breast: Secondary | ICD-10-CM

## 2018-08-27 ENCOUNTER — Other Ambulatory Visit: Payer: Self-pay | Admitting: *Deleted

## 2018-08-27 NOTE — Patient Outreach (Signed)
Late entry 07/08/18- Telephone call to patient for follow up on [place]. Spoke with pt, explained program, pt states " I'm very healthy, very proactive"  Pt states she does not have any needs at present.  Per Surveyor, quantity pt to be placed into engagement program.  RN CM mailed welcome letter with 24 hour nurse line magnet and pamphlet.    PLAN Outreach pt in 6 months  Jacqlyn Larsen [facility], [place] Coordinator [phone]

## 2018-09-29 ENCOUNTER — Other Ambulatory Visit: Payer: Self-pay | Admitting: *Deleted

## 2018-09-29 NOTE — Patient Outreach (Signed)
Case closed, pt enrolled in external program [facility].  RN CM faxed case closure letter to primary MD.  Case closed  Jacqlyn Larsen [facility], [place] Coordinator [phone]

## 2018-12-03 DIAGNOSIS — D1801 Hemangioma of skin and subcutaneous tissue: Secondary | ICD-10-CM | POA: Diagnosis not present

## 2018-12-03 DIAGNOSIS — L814 Other melanin hyperpigmentation: Secondary | ICD-10-CM | POA: Diagnosis not present

## 2018-12-03 DIAGNOSIS — L821 Other seborrheic keratosis: Secondary | ICD-10-CM | POA: Diagnosis not present

## 2018-12-03 DIAGNOSIS — L570 Actinic keratosis: Secondary | ICD-10-CM | POA: Diagnosis not present

## 2018-12-03 DIAGNOSIS — L298 Other pruritus: Secondary | ICD-10-CM | POA: Diagnosis not present

## 2018-12-03 DIAGNOSIS — D225 Melanocytic nevi of trunk: Secondary | ICD-10-CM | POA: Diagnosis not present

## 2019-01-07 ENCOUNTER — Ambulatory Visit: Payer: PPO | Admitting: *Deleted

## 2019-01-11 DIAGNOSIS — E559 Vitamin D deficiency, unspecified: Secondary | ICD-10-CM | POA: Diagnosis not present

## 2019-01-11 DIAGNOSIS — Z0000 Encounter for general adult medical examination without abnormal findings: Secondary | ICD-10-CM | POA: Diagnosis not present

## 2019-01-11 DIAGNOSIS — Z86010 Personal history of colonic polyps: Secondary | ICD-10-CM | POA: Diagnosis not present

## 2019-01-11 DIAGNOSIS — I10 Essential (primary) hypertension: Secondary | ICD-10-CM | POA: Diagnosis not present

## 2019-01-11 DIAGNOSIS — M8580 Other specified disorders of bone density and structure, unspecified site: Secondary | ICD-10-CM | POA: Diagnosis not present

## 2019-01-11 DIAGNOSIS — Z1389 Encounter for screening for other disorder: Secondary | ICD-10-CM | POA: Diagnosis not present

## 2019-05-23 ENCOUNTER — Ambulatory Visit: Payer: PPO | Attending: Internal Medicine

## 2019-05-23 DIAGNOSIS — Z23 Encounter for immunization: Secondary | ICD-10-CM | POA: Insufficient documentation

## 2019-05-23 NOTE — Progress Notes (Signed)
   Covid-19 Vaccination Clinic  Name:  KWANISHA SEUFERT    MRN: [id number] DOB: 02/05/1946  05/23/2019  Ms. Maragh was observed post Covid-19 immunization for 15 minutes without incidence. She was provided with Vaccine Information Sheet and instruction to access the V-Safe system.   Ms. Lans was instructed to call 911 with any severe reactions post vaccine: Marland Kitchen Difficulty breathing  . Swelling of your face and throat  . A fast heartbeat  . A bad rash all over your body  . Dizziness and weakness    Immunizations Administered    Name Date Dose VIS Date Route   Pfizer COVID-19 Vaccine 05/23/2019  8:58 AM 0.3 mL 03/19/2019 Intramuscular   Manufacturer: [place]   Lot: [id number]   [place]: [id number]

## 2019-06-14 ENCOUNTER — Ambulatory Visit: Payer: PPO | Attending: Internal Medicine

## 2019-06-14 DIAGNOSIS — Z23 Encounter for immunization: Secondary | ICD-10-CM | POA: Insufficient documentation

## 2019-06-14 NOTE — Progress Notes (Signed)
   Covid-19 Vaccination Clinic  Name:  Zoe Jackson    MRN: [id number] DOB: 11/27/1945  06/14/2019  [name] was observed post Covid-19 immunization for 15 minutes without incident. She was provided with Vaccine Information Sheet and instruction to access the V-Safe system.   [name] was instructed to call 911 with any severe reactions post vaccine: Marland Kitchen Difficulty breathing  . Swelling of face and throat  . A fast heartbeat  . A bad rash all over body  . Dizziness and weakness   Immunizations Administered    Name Date Dose VIS Date Route   Pfizer COVID-19 Vaccine 06/14/2019  5:34 PM 0.3 mL 03/19/2019 Intramuscular   Manufacturer: [place]   Lot: [id number]   [place]: [id number]

## 2019-07-29 ENCOUNTER — Other Ambulatory Visit: Payer: Self-pay | Admitting: Family Medicine

## 2019-07-29 DIAGNOSIS — Z1231 Encounter for screening mammogram for malignant neoplasm of breast: Secondary | ICD-10-CM

## 2019-07-29 DIAGNOSIS — M8580 Other specified disorders of bone density and structure, unspecified site: Secondary | ICD-10-CM

## 2019-08-03 ENCOUNTER — Ambulatory Visit
Admission: RE | Admit: 2019-08-03 | Discharge: 2019-08-03 | Disposition: A | Discharge status: Home / Self Care | Payer: PPO | Source: Ambulatory Visit | Attending: Family Medicine | Admitting: Family Medicine

## 2019-08-03 ENCOUNTER — Other Ambulatory Visit: Payer: Self-pay

## 2019-08-03 DIAGNOSIS — Z1231 Encounter for screening mammogram for malignant neoplasm of breast: Secondary | ICD-10-CM

## 2019-08-03 DIAGNOSIS — M8580 Other specified disorders of bone density and structure, unspecified site: Secondary | ICD-10-CM

## 2019-08-03 DIAGNOSIS — Z780 Asymptomatic menopausal state: Secondary | ICD-10-CM | POA: Diagnosis not present

## 2019-08-03 DIAGNOSIS — M8589 Other specified disorders of bone density and structure, multiple sites: Secondary | ICD-10-CM | POA: Diagnosis not present

## 2019-08-03 DIAGNOSIS — B029 Zoster without complications: Secondary | ICD-10-CM | POA: Diagnosis not present

## 2019-08-05 DIAGNOSIS — M8580 Other specified disorders of bone density and structure, unspecified site: Secondary | ICD-10-CM | POA: Diagnosis not present

## 2019-08-05 DIAGNOSIS — B029 Zoster without complications: Secondary | ICD-10-CM | POA: Diagnosis not present

## 2019-08-19 DIAGNOSIS — M549 Dorsalgia, unspecified: Secondary | ICD-10-CM | POA: Diagnosis not present

## 2019-08-19 DIAGNOSIS — M2539 Other instability, other specified joint: Secondary | ICD-10-CM | POA: Diagnosis not present

## 2019-08-19 DIAGNOSIS — M6281 Muscle weakness (generalized): Secondary | ICD-10-CM | POA: Diagnosis not present

## 2019-08-19 DIAGNOSIS — M8588 Other specified disorders of bone density and structure, other site: Secondary | ICD-10-CM | POA: Diagnosis not present

## 2019-08-25 DIAGNOSIS — M2539 Other instability, other specified joint: Secondary | ICD-10-CM | POA: Diagnosis not present

## 2019-08-25 DIAGNOSIS — M8588 Other specified disorders of bone density and structure, other site: Secondary | ICD-10-CM | POA: Diagnosis not present

## 2019-08-25 DIAGNOSIS — M6281 Muscle weakness (generalized): Secondary | ICD-10-CM | POA: Diagnosis not present

## 2019-08-25 DIAGNOSIS — M549 Dorsalgia, unspecified: Secondary | ICD-10-CM | POA: Diagnosis not present

## 2019-09-01 DIAGNOSIS — W57XXXA Bitten or stung by nonvenomous insect and other nonvenomous arthropods, initial encounter: Secondary | ICD-10-CM | POA: Diagnosis not present

## 2019-09-01 DIAGNOSIS — S80861A Insect bite (nonvenomous), right lower leg, initial encounter: Secondary | ICD-10-CM | POA: Diagnosis not present

## 2019-09-08 DIAGNOSIS — M2539 Other instability, other specified joint: Secondary | ICD-10-CM | POA: Diagnosis not present

## 2019-09-08 DIAGNOSIS — M549 Dorsalgia, unspecified: Secondary | ICD-10-CM | POA: Diagnosis not present

## 2019-09-08 DIAGNOSIS — M8588 Other specified disorders of bone density and structure, other site: Secondary | ICD-10-CM | POA: Diagnosis not present

## 2019-09-08 DIAGNOSIS — M6281 Muscle weakness (generalized): Secondary | ICD-10-CM | POA: Diagnosis not present

## 2019-09-15 DIAGNOSIS — M549 Dorsalgia, unspecified: Secondary | ICD-10-CM | POA: Diagnosis not present

## 2019-09-15 DIAGNOSIS — M8588 Other specified disorders of bone density and structure, other site: Secondary | ICD-10-CM | POA: Diagnosis not present

## 2019-09-15 DIAGNOSIS — M6281 Muscle weakness (generalized): Secondary | ICD-10-CM | POA: Diagnosis not present

## 2019-09-15 DIAGNOSIS — M2539 Other instability, other specified joint: Secondary | ICD-10-CM | POA: Diagnosis not present

## 2019-10-13 DIAGNOSIS — Z86010 Personal history of colonic polyps: Secondary | ICD-10-CM | POA: Diagnosis not present

## 2019-11-16 DIAGNOSIS — D120 Benign neoplasm of cecum: Secondary | ICD-10-CM | POA: Diagnosis not present

## 2019-11-16 DIAGNOSIS — K621 Rectal polyp: Secondary | ICD-10-CM | POA: Diagnosis not present

## 2019-11-16 DIAGNOSIS — Z86010 Personal history of colonic polyps: Secondary | ICD-10-CM | POA: Diagnosis not present

## 2019-11-19 DIAGNOSIS — K621 Rectal polyp: Secondary | ICD-10-CM | POA: Diagnosis not present

## 2019-11-19 DIAGNOSIS — D120 Benign neoplasm of cecum: Secondary | ICD-10-CM | POA: Diagnosis not present

## 2019-12-02 DIAGNOSIS — D225 Melanocytic nevi of trunk: Secondary | ICD-10-CM | POA: Diagnosis not present

## 2019-12-02 DIAGNOSIS — L821 Other seborrheic keratosis: Secondary | ICD-10-CM | POA: Diagnosis not present

## 2019-12-02 DIAGNOSIS — L814 Other melanin hyperpigmentation: Secondary | ICD-10-CM | POA: Diagnosis not present

## 2019-12-02 DIAGNOSIS — D485 Neoplasm of uncertain behavior of skin: Secondary | ICD-10-CM | POA: Diagnosis not present

## 2019-12-02 DIAGNOSIS — L570 Actinic keratosis: Secondary | ICD-10-CM | POA: Diagnosis not present

## 2020-01-13 DIAGNOSIS — Z1159 Encounter for screening for other viral diseases: Secondary | ICD-10-CM | POA: Diagnosis not present

## 2020-01-13 DIAGNOSIS — Z0000 Encounter for general adult medical examination without abnormal findings: Secondary | ICD-10-CM | POA: Diagnosis not present

## 2020-01-13 DIAGNOSIS — M8588 Other specified disorders of bone density and structure, other site: Secondary | ICD-10-CM | POA: Diagnosis not present

## 2020-01-13 DIAGNOSIS — E559 Vitamin D deficiency, unspecified: Secondary | ICD-10-CM | POA: Diagnosis not present

## 2020-01-13 DIAGNOSIS — I10 Essential (primary) hypertension: Secondary | ICD-10-CM | POA: Diagnosis not present

## 2020-04-19 DIAGNOSIS — Z1159 Encounter for screening for other viral diseases: Secondary | ICD-10-CM | POA: Diagnosis not present

## 2020-04-21 DIAGNOSIS — H6123 Impacted cerumen, bilateral: Secondary | ICD-10-CM | POA: Diagnosis not present

## 2020-04-21 DIAGNOSIS — G510 Bell's palsy: Secondary | ICD-10-CM | POA: Diagnosis not present

## 2020-05-15 DIAGNOSIS — R202 Paresthesia of skin: Secondary | ICD-10-CM | POA: Diagnosis not present

## 2020-05-26 DIAGNOSIS — S60454A Superficial foreign body of right ring finger, initial encounter: Secondary | ICD-10-CM | POA: Diagnosis not present

## 2020-08-01 DIAGNOSIS — H6983 Other specified disorders of Eustachian tube, bilateral: Secondary | ICD-10-CM | POA: Diagnosis not present

## 2020-08-01 DIAGNOSIS — R0981 Nasal congestion: Secondary | ICD-10-CM | POA: Diagnosis not present

## 2020-08-01 DIAGNOSIS — H938X3 Other specified disorders of ear, bilateral: Secondary | ICD-10-CM | POA: Diagnosis not present

## 2020-08-01 DIAGNOSIS — Z03818 Encounter for observation for suspected exposure to other biological agents ruled out: Secondary | ICD-10-CM | POA: Diagnosis not present

## 2020-09-12 ENCOUNTER — Other Ambulatory Visit: Payer: Self-pay | Admitting: Family Medicine

## 2020-09-12 DIAGNOSIS — L821 Other seborrheic keratosis: Secondary | ICD-10-CM | POA: Diagnosis not present

## 2020-09-12 DIAGNOSIS — L308 Other specified dermatitis: Secondary | ICD-10-CM | POA: Diagnosis not present

## 2020-09-12 DIAGNOSIS — L988 Other specified disorders of the skin and subcutaneous tissue: Secondary | ICD-10-CM | POA: Diagnosis not present

## 2020-09-12 DIAGNOSIS — D225 Melanocytic nevi of trunk: Secondary | ICD-10-CM | POA: Diagnosis not present

## 2020-09-12 DIAGNOSIS — L570 Actinic keratosis: Secondary | ICD-10-CM | POA: Diagnosis not present

## 2020-09-12 DIAGNOSIS — L814 Other melanin hyperpigmentation: Secondary | ICD-10-CM | POA: Diagnosis not present

## 2020-09-12 DIAGNOSIS — Z1231 Encounter for screening mammogram for malignant neoplasm of breast: Secondary | ICD-10-CM

## 2020-09-16 ENCOUNTER — Ambulatory Visit
Admission: RE | Admit: 2020-09-16 | Discharge: 2020-09-16 | Disposition: A | Discharge status: Home / Self Care | Payer: PPO | Source: Ambulatory Visit | Attending: Family Medicine | Admitting: Family Medicine

## 2020-09-16 ENCOUNTER — Other Ambulatory Visit: Payer: Self-pay

## 2020-09-16 DIAGNOSIS — Z1231 Encounter for screening mammogram for malignant neoplasm of breast: Secondary | ICD-10-CM

## 2020-09-21 DIAGNOSIS — R0981 Nasal congestion: Secondary | ICD-10-CM | POA: Diagnosis not present

## 2020-09-21 DIAGNOSIS — Z20822 Contact with and (suspected) exposure to covid-19: Secondary | ICD-10-CM | POA: Diagnosis not present

## 2020-11-23 DIAGNOSIS — H5213 Myopia, bilateral: Secondary | ICD-10-CM | POA: Diagnosis not present

## 2020-11-23 DIAGNOSIS — H2513 Age-related nuclear cataract, bilateral: Secondary | ICD-10-CM | POA: Diagnosis not present

## 2020-11-25 DIAGNOSIS — T63421A Toxic effect of venom of ants, accidental (unintentional), initial encounter: Secondary | ICD-10-CM | POA: Diagnosis not present

## 2020-12-09 DIAGNOSIS — R509 Fever, unspecified: Secondary | ICD-10-CM | POA: Diagnosis not present

## 2020-12-09 DIAGNOSIS — J029 Acute pharyngitis, unspecified: Secondary | ICD-10-CM | POA: Diagnosis not present

## 2020-12-09 DIAGNOSIS — R110 Nausea: Secondary | ICD-10-CM | POA: Diagnosis not present

## 2020-12-09 DIAGNOSIS — Z03818 Encounter for observation for suspected exposure to other biological agents ruled out: Secondary | ICD-10-CM | POA: Diagnosis not present

## 2021-01-20 DIAGNOSIS — R059 Cough, unspecified: Secondary | ICD-10-CM | POA: Diagnosis not present

## 2021-01-20 DIAGNOSIS — Z03818 Encounter for observation for suspected exposure to other biological agents ruled out: Secondary | ICD-10-CM | POA: Diagnosis not present

## 2021-01-20 DIAGNOSIS — J029 Acute pharyngitis, unspecified: Secondary | ICD-10-CM | POA: Diagnosis not present

## 2021-01-20 DIAGNOSIS — J069 Acute upper respiratory infection, unspecified: Secondary | ICD-10-CM | POA: Diagnosis not present

## 2021-01-22 DIAGNOSIS — E559 Vitamin D deficiency, unspecified: Secondary | ICD-10-CM | POA: Diagnosis not present

## 2021-01-22 DIAGNOSIS — Z0000 Encounter for general adult medical examination without abnormal findings: Secondary | ICD-10-CM | POA: Diagnosis not present

## 2021-01-22 DIAGNOSIS — Z8543 Personal history of malignant neoplasm of ovary: Secondary | ICD-10-CM | POA: Diagnosis not present

## 2021-01-22 DIAGNOSIS — M8588 Other specified disorders of bone density and structure, other site: Secondary | ICD-10-CM | POA: Diagnosis not present

## 2021-01-22 DIAGNOSIS — I10 Essential (primary) hypertension: Secondary | ICD-10-CM | POA: Diagnosis not present

## 2021-01-22 DIAGNOSIS — J301 Allergic rhinitis due to pollen: Secondary | ICD-10-CM | POA: Diagnosis not present

## 2021-01-25 ENCOUNTER — Other Ambulatory Visit: Payer: Self-pay | Admitting: Family Medicine

## 2021-01-25 DIAGNOSIS — M8580 Other specified disorders of bone density and structure, unspecified site: Secondary | ICD-10-CM

## 2021-02-01 ENCOUNTER — Telehealth: Payer: Self-pay | Admitting: Genetic Counselor

## 2021-02-01 NOTE — Telephone Encounter (Signed)
Scheduled appt per 10/21 referral. Pt is aware of appt date and time.  

## 2021-02-06 ENCOUNTER — Other Ambulatory Visit: Payer: Self-pay | Admitting: Genetic Counselor

## 2021-02-06 DIAGNOSIS — C569 Malignant neoplasm of unspecified ovary: Secondary | ICD-10-CM

## 2021-02-07 ENCOUNTER — Other Ambulatory Visit: Payer: Self-pay

## 2021-02-07 ENCOUNTER — Inpatient Hospital Stay: Payer: PPO | Attending: Genetic Counselor | Admitting: Genetic Counselor

## 2021-02-07 ENCOUNTER — Encounter: Payer: Self-pay | Admitting: Genetic Counselor

## 2021-02-07 ENCOUNTER — Inpatient Hospital Stay: Payer: PPO

## 2021-02-07 DIAGNOSIS — C569 Malignant neoplasm of unspecified ovary: Secondary | ICD-10-CM | POA: Diagnosis not present

## 2021-02-07 DIAGNOSIS — Z801 Family history of malignant neoplasm of trachea, bronchus and lung: Secondary | ICD-10-CM | POA: Diagnosis not present

## 2021-02-07 LAB — GENETIC SCREENING ORDER

## 2021-02-07 NOTE — Progress Notes (Addendum)
REFERRING PROVIDER: Harlan Stains, MD [address] [place],  [place] [address]  PRIMARY PROVIDER:  Harlan Stains, MD  PRIMARY REASON FOR VISIT:  1. Malignant neoplasm of ovary, unspecified laterality (Zoe Jackson)      HISTORY OF PRESENT ILLNESS:   Zoe Jackson, a 75 y.o. female, was seen for a [place] cancer genetics consultation at the request of Dr. Dema Severin due to a personal history of ovarian cancer.  Zoe Jackson presents to clinic today to discuss the possibility of a hereditary predisposition to cancer, genetic testing, and to further clarify her future cancer risks, as well as potential cancer risks for family members.   In 1968, at the age of 48, Zoe Jackson was diagnosed with an ovarian cancer/tumor.  The treatment plan included [place] treatment. UPDATE: 02/13/2021.  The patient reported that she had 11 colon polyps in her lifetime, thus having polyposis.    CANCER HISTORY:  Oncology History   No history exists.     RISK FACTORS:  Menarche was at age 37.  First live birth at age N/A.  OCP use for approximately 0 years.  Ovaries intact: no.  Hysterectomy: yes.  Menopausal status: postmenopausal.  HRT use:  50  years. Colonoscopy: yes;  8 polyps total . Mammogram within the last year: yes. Number of breast biopsies: 0. Up to date with pelvic exams: n/a. Any excessive radiation exposure in the past: no  Past Medical History:  Diagnosis Date   MVP (mitral valve prolapse)    Ovarian cancer ([place])    SBO (small bowel obstruction) ([place])     Past Surgical History:  Procedure Laterality Date   ABDOMINAL EXPLORATION SURGERY  2007   LOA   ABDOMINAL HYSTERECTOMY  40 yrs ago   TAH   CHOLECYSTECTOMY     ERCP N/A 03/14/2013   Procedure: ENDOSCOPIC RETROGRADE CHOLANGIOPANCREATOGRAPHY (ERCP);  Surgeon: Missy Sabins, MD;  Location[facility];  Service: Endoscopy;  Laterality: N/A;    Social History   Socioeconomic History   Marital status: Married    Spouse  name: Not on file   Number of children: 0   Years of education: Not on file   Highest education level: Not on file  Occupational History   Not on file  Tobacco Use   Smoking status: Never   Smokeless tobacco: Never  Substance and Sexual Activity   Alcohol use: No   Drug use: No   Sexual activity: Not on file  Other Topics Concern   Not on file  Social History Narrative   Lives with husband.     Social Determinants of Health   Financial Resource Strain: Not on file  Food Insecurity: Not on file  Transportation Needs: Not on file  Physical Activity: Not on file  Stress: Not on file  Social Connections: Not on file     FAMILY HISTORY:  We obtained a detailed, 4-generation family history.  Significant diagnoses are listed below: Family History  Problem Relation Age of Onset   Dementia Mother    Hypertension Mother    Lung cancer Mother 38   Parkinson's disease Father    Hypertension Father    CAD Brother 41       Died of MI 04-21-23   Dementia Maternal Grandmother    Heart attack Maternal Grandfather    Dementia Paternal Grandfather     The patient does not have children.  She has two sisters and three brothers who are all cancer free.  Both  parents are deceased.  The patient's mother died of lung cancer at 54.  She had three sisters who were all cancer free.  Her parents are deceased.  Her mother had a brother who's daughter had stomach cancer at 70.  The patient's father died at 62.  He had 11 siblings who were cancer free.  His parents are deceased from non-cancer related issues.  Zoe Jackson is unaware of previous family history of genetic testing for hereditary cancer risks. Patient's maternal ancestors are of [place], [place] and [place] descent, and paternal ancestors are of [place] and [place] descent. There is no reported Ashkenazi Jewish ancestry. There is no known consanguinity.  GENETIC COUNSELING ASSESSMENT: Zoe Jackson is a 75 y.o. female with a personal  history of ovarian cancer which is somewhat suggestive of a hereditary cancer syndrome and predisposition to cancer given the type of cancer she has. We, therefore, discussed and recommended the following at today's visit.   DISCUSSION: We discussed that, in general, most cancer is not inherited in families, but instead is sporadic or familial. Sporadic cancers occur by chance and typically happen at older ages (>50 years) as this type of cancer is caused by genetic changes acquired during an individual's lifetime. Some families have more cancers than would be expected by chance; however, the ages or types of cancer are not consistent with a known genetic mutation or known genetic mutations have been ruled out. This type of familial cancer is thought to be due to a combination of multiple genetic, environmental, hormonal, and lifestyle factors. While this combination of factors likely increases the risk of cancer, the exact source of this risk is not currently identifiable or testable.  We discussed that up to 20% of ovarian cancer is hereditary, with most cases associated with BRCA mutations.  There are other genes that can be associated with hereditary ovarian cancer syndromes.  These include Lynch syndrome genes, BRIP1, and RAD51C/51D.  We discussed that testing is beneficial for several reasons including knowing how to follow individuals after completing their treatment, identifying whether potential treatment options such as PARP inhibitors would be beneficial, and understand if other family members could be at risk for cancer and allow them to undergo genetic testing.   We reviewed the characteristics, features and inheritance patterns of hereditary cancer syndromes. We also discussed genetic testing, including the appropriate family members to test, the process of testing, insurance coverage and turn-around-time for results. We discussed the implications of a negative, positive, carrier and/or variant  of uncertain significant result. We recommended Zoe Jackson pursue genetic testing for the CancerNext-Expanded+RNAinsight gene panel.   The CancerNext-Expanded gene panel offered by [facility] and includes sequencing and rearrangement analysis for the following 77 genes: AIP, ALK, APC*, ATM*, AXIN2, BAP1, BARD1, BLM, BMPR1A, BRCA1*, BRCA2*, BRIP1*, CDC73, CDH1*, CDK4, CDKN1B, CDKN2A, CHEK2*, CTNNA1, DICER1, FANCC, FH, FLCN, GALNT12, KIF1B, LZTR1, MAX, MEN1, MET, MLH1*, MSH2*, MSH3, MSH6*, MUTYH*, NBN, NF1*, NF2, NTHL1, PALB2*, PHOX2B, PMS2*, POT1, PRKAR1A, PTCH1, PTEN*, RAD51C*, RAD51D*, RB1, RECQL, RET, SDHA, SDHAF2, SDHB, SDHC, SDHD, SMAD4, SMARCA4, SMARCB1, SMARCE1, STK11, SUFU, TMEM127, TP53*, TSC1, TSC2, VHL and XRCC2 (sequencing and deletion/duplication); EGFR, EGLN1, HOXB13, KIT, MITF, PDGFRA, POLD1, and POLE (sequencing only); EPCAM and GREM1 (deletion/duplication only). DNA and RNA analyses performed for * genes.   Based on Zoe Jackson's personal history of cancer, she meets medical criteria for genetic testing. Despite that she meets criteria, she may still have an out of pocket cost. We discussed that if her out of  pocket cost for testing is over $100, the laboratory will call and confirm whether she wants to proceed with testing.  If the out of pocket cost of testing is less than $100 she will be billed by the genetic testing laboratory.   PLAN: After considering the risks, benefits, and limitations, Zoe Jackson provided informed consent to pursue genetic testing and the blood sample was sent to [facility] for analysis of the CancerNext-Expanded+RNAinsight. Results should be available within approximately 2-3 weeks' time, at which point they will be disclosed by telephone to Zoe Jackson, as will any additional recommendations warranted by these results. Zoe Jackson will receive a summary of her genetic counseling visit and a copy of her results once available. This information  will also be available in Epic.   Lastly, we encouraged Zoe Jackson to remain in contact with cancer genetics annually so that we can continuously update the family history and inform her of any changes in cancer genetics and testing that may be of benefit for this family.   Zoe Jackson's questions were answered to her satisfaction today. Our contact information was provided should additional questions or concerns arise. Thank you for the referral and allowing [place] to share in the care of your patient.   Nash Bolls P. [place]ne [place], [place], [facility] Licensed, Insurance risk surveyor [email or web address] phone: [phone]  The patient was seen for a total of 45 minutes in face-to-face genetic counseling.  The patient was seen alone.  This patient was discussed with Drs. Magrinat, Lindi Adie and/or Burr Medico who agrees with the above.    _______________________________________________________________________ For Office Staff:  Number of people involved in session: 1 Was an Intern/ student involved with case: no

## 2021-02-08 ENCOUNTER — Other Ambulatory Visit: Payer: Self-pay | Admitting: Family Medicine

## 2021-02-08 DIAGNOSIS — Z1231 Encounter for screening mammogram for malignant neoplasm of breast: Secondary | ICD-10-CM

## 2021-02-09 ENCOUNTER — Encounter: Payer: Self-pay | Admitting: Genetic Counselor

## 2021-02-09 DIAGNOSIS — R0981 Nasal congestion: Secondary | ICD-10-CM | POA: Diagnosis not present

## 2021-02-09 DIAGNOSIS — R6883 Chills (without fever): Secondary | ICD-10-CM | POA: Diagnosis not present

## 2021-02-09 DIAGNOSIS — R059 Cough, unspecified: Secondary | ICD-10-CM | POA: Diagnosis not present

## 2021-02-09 DIAGNOSIS — U071 COVID-19: Secondary | ICD-10-CM | POA: Diagnosis not present

## 2021-02-09 DIAGNOSIS — R5383 Other fatigue: Secondary | ICD-10-CM | POA: Diagnosis not present

## 2021-02-18 DIAGNOSIS — U071 COVID-19: Secondary | ICD-10-CM | POA: Diagnosis not present

## 2021-02-22 ENCOUNTER — Telehealth: Payer: Self-pay | Admitting: Genetic Counselor

## 2021-02-22 ENCOUNTER — Encounter: Payer: Self-pay | Admitting: Genetic Counselor

## 2021-02-22 DIAGNOSIS — Z1379 Encounter for other screening for genetic and chromosomal anomalies: Secondary | ICD-10-CM | POA: Insufficient documentation

## 2021-02-22 NOTE — Telephone Encounter (Signed)
LM on VM that results are back and to please call. 

## 2021-02-23 ENCOUNTER — Ambulatory Visit: Payer: Self-pay | Admitting: Genetic Counselor

## 2021-02-23 DIAGNOSIS — Z1379 Encounter for other screening for genetic and chromosomal anomalies: Secondary | ICD-10-CM

## 2021-02-23 DIAGNOSIS — C569 Malignant neoplasm of unspecified ovary: Secondary | ICD-10-CM

## 2021-02-23 NOTE — Telephone Encounter (Signed)
Revealed negative genetic testing.  Discussed that we do not know why she has polyposis and a history of ovarian cancer or why there is cancer in the family. It could be due to a different gene that we are not testing, or maybe our current technology may not be able to pick something up.  It will be important for her to keep in contact with genetics to keep up with whether additional testing may be needed.

## 2021-02-23 NOTE — Progress Notes (Signed)
HPI:  Ms. Bergsma was previously seen in the [facility] clinic due to a personal and family history of cancer, a personal history of polyposis, and concerns regarding a hereditary predisposition to cancer. Please refer to our prior cancer genetics clinic note for more information regarding our discussion, assessment and recommendations, at the time. Ms. Doering's recent genetic test results were disclosed to her, as were recommendations warranted by these results. These results and recommendations are discussed in more detail below.  CANCER HISTORY:  Oncology History  Ovarian cancer ([place])  02/07/2021 Initial Diagnosis   Ovarian cancer ([place])   02/21/2021 Genetic Testing   Negative genetic testing on the CancerNext-Expanded+RNAinsight.  The report date is 02/21/2021.  The CancerNext-Expanded gene panel offered by [facility] and includes sequencing and rearrangement analysis for the following 77 genes: AIP, ALK, APC*, ATM*, AXIN2, BAP1, BARD1, BLM, BMPR1A, BRCA1*, BRCA2*, BRIP1*, CDC73, CDH1*, CDK4, CDKN1B, CDKN2A, CHEK2*, CTNNA1, DICER1, FANCC, FH, FLCN, GALNT12, KIF1B, LZTR1, MAX, MEN1, MET, MLH1*, MSH2*, MSH3, MSH6*, MUTYH*, NBN, NF1*, NF2, NTHL1, PALB2*, PHOX2B, PMS2*, POT1, PRKAR1A, PTCH1, PTEN*, RAD51C*, RAD51D*, RB1, RECQL, RET, SDHA, SDHAF2, SDHB, SDHC, SDHD, SMAD4, SMARCA4, SMARCB1, SMARCE1, STK11, SUFU, TMEM127, TP53*, TSC1, TSC2, VHL and XRCC2 (sequencing and deletion/duplication); EGFR, EGLN1, HOXB13, KIT, MITF, PDGFRA, POLD1, and POLE (sequencing only); EPCAM and GREM1 (deletion/duplication only). DNA and RNA analyses performed for * genes.      FAMILY HISTORY:  We obtained a detailed, 4-generation family history.  Significant diagnoses are listed below: Family History  Problem Relation Age of Onset   Dementia Mother    Hypertension Mother    Lung cancer Mother 54   Parkinson's disease Father    Hypertension Father    CAD Brother 64       Died of MI 04-07-23    Dementia Maternal Grandmother    Heart attack Maternal Grandfather    Dementia Paternal Grandfather    Pancreatic cancer Cousin        pat first cousin    The patient does not have children.  She has two sisters and three brothers who are all cancer free.  Both parents are deceased.  The patient's mother died of lung cancer at 80.  She had three sisters who were all cancer free.  Her parents are deceased.  Her mother had a brother who's daughter had stomach cancer at 66.   The patient's father died at 78.  He had 11 siblings who were cancer free.  A paternal first cousin was diagnosed and died of pancreatic cancer. His parents are deceased from non-cancer related issues.   Ms. Hanko is unaware of previous family history of genetic testing for hereditary cancer risks. Patient's maternal ancestors are of [place], [place] and [place] descent, and paternal ancestors are of [place] and [place] descent. There is no reported Ashkenazi Jewish ancestry. There is no known consanguinity.   GENETIC TEST RESULTS: Genetic testing reported out on February 21, 2021 through the CancerNext-Expanded+RNAinsight cancer panel found no pathogenic mutations. The CancerNext-Expanded gene panel offered by [facility] and includes sequencing and rearrangement analysis for the following 77 genes: AIP, ALK, APC*, ATM*, AXIN2, BAP1, BARD1, BLM, BMPR1A, BRCA1*, BRCA2*, BRIP1*, CDC73, CDH1*, CDK4, CDKN1B, CDKN2A, CHEK2*, CTNNA1, DICER1, FANCC, FH, FLCN, GALNT12, KIF1B, LZTR1, MAX, MEN1, MET, MLH1*, MSH2*, MSH3, MSH6*, MUTYH*, NBN, NF1*, NF2, NTHL1, PALB2*, PHOX2B, PMS2*, POT1, PRKAR1A, PTCH1, PTEN*, RAD51C*, RAD51D*, RB1, RECQL, RET, SDHA, SDHAF2, SDHB, SDHC, SDHD, SMAD4, SMARCA4, SMARCB1, SMARCE1, STK11, SUFU, TMEM127, TP53*, TSC1, TSC2, VHL  and XRCC2 (sequencing and deletion/duplication); EGFR, EGLN1, HOXB13, KIT, MITF, PDGFRA, POLD1, and POLE (sequencing only); EPCAM and GREM1 (deletion/duplication only). DNA and RNA analyses  performed for * genes. The test report has been scanned into EPIC and is located under the Molecular Pathology section of the Results Review tab.  A portion of the result report is included below for reference.     We discussed with Ms. Vu that because current genetic testing is not perfect, it is possible there may be a gene mutation in one of these genes that current testing cannot detect, but that chance is small.  We also discussed, that there could be another gene that has not yet been discovered, or that we have not yet tested, that is responsible for the cancer diagnoses in the family. It is also possible there is a hereditary cause for the cancer in the family that Ms. Lehigh did not inherit and therefore was not identified in her testing.  Therefore, it is important to remain in touch with cancer genetics in the future so that we can continue to offer Ms. Guiney the most up to date genetic testing.   ADDITIONAL GENETIC TESTING: We discussed with Ms. Hodgens that her genetic testing was fairly extensive.  If there are genes identified to increase cancer risk that can be analyzed in the future, we would be happy to discuss and coordinate this testing at that time.    CANCER SCREENING RECOMMENDATIONS: Ms. Lawler's test result is considered negative (normal).  This means that we have not identified a hereditary cause for her personal and family history of cancer at this time. Most cancers happen by chance and this negative test suggests that her cancer may fall into this category.    While reassuring, this does not definitively rule out a hereditary predisposition to cancer. It is still possible that there could be genetic mutations that are undetectable by current technology. There could be genetic mutations in genes that have not been tested or identified to increase cancer risk.  Therefore, it is recommended she continue to follow the cancer management and screening guidelines provided by  her oncology and primary healthcare provider.   An individual's cancer risk and medical management are not determined by genetic test results alone. Overall cancer risk assessment incorporates additional factors, including personal medical history, family history, and any available genetic information that may result in a personalized plan for cancer prevention and surveillance  RECOMMENDATIONS FOR FAMILY MEMBERS:  Individuals in this family might be at some increased risk of developing cancer, over the general population risk, simply due to the family history of cancer.  We recommended women in this family have a yearly mammogram beginning at age 13, or 31 years younger than the earliest onset of cancer, an annual clinical breast exam, and perform monthly breast self-exams. Women in this family should also have a gynecological exam as recommended by their primary provider. All family members should be referred for colonoscopy starting at age 39.  FOLLOW-UP: Lastly, we discussed with Ms. Worthley that cancer genetics is a rapidly advancing field and it is possible that new genetic tests will be appropriate for her and/or her family members in the future. We encouraged her to remain in contact with cancer genetics on an annual basis so we can update her personal and family histories and let her know of advances in cancer genetics that may benefit this family.   Our contact number was provided. Ms. Goonan's questions were answered to  her satisfaction, and she knows she is welcome to call us at anytime with additional questions or concerns.   Roma Kayser, [place], [facility] Licensed, Certified Genetic Counselor [email or web address]

## 2021-05-19 DIAGNOSIS — R6889 Other general symptoms and signs: Secondary | ICD-10-CM | POA: Diagnosis not present

## 2021-06-18 DIAGNOSIS — K219 Gastro-esophageal reflux disease without esophagitis: Secondary | ICD-10-CM | POA: Diagnosis not present

## 2021-06-18 DIAGNOSIS — I10 Essential (primary) hypertension: Secondary | ICD-10-CM | POA: Diagnosis not present

## 2021-06-21 DIAGNOSIS — R030 Elevated blood-pressure reading, without diagnosis of hypertension: Secondary | ICD-10-CM | POA: Diagnosis not present

## 2021-07-04 NOTE — Progress Notes (Signed)
?  ? ? ?Cardiology Office Note ? ? ?Date:  07/05/2021  ? ?ID:  Zoe Jackson, DOB 03-27-1946, MRN [id number] ? ?PCP:  Harlan Stains, MD  ?Cardiologist:   Minus Breeding, MD ?Referring:  Harlan Stains, MD ? ?Chief Complaint  ?Patient presents with  ? Mitral Valve Prolapse  ? ? ?  ?History of Present Illness: ?Zoe Jackson is a 76 y.o. female who presents for hypertension and back pain.   I saw her last in 2019.  She had mild MVP and had a normal EF.  Nuclear stress test was negative.  At that time she had SOB and chest pain.   She said that she had COVID in November and since then she has had waves of fatigue.  She is also noticed her blood pressure going up.  She reduced and almost limited salt and came back down but back to normal levels.  She reluctantly started amlodipine and actually has eventually done well with this and reports blood pressures in the 120s over 70s.  However, she was worried that she might have some inflammation residual from the COVID because of this.  She is not having any chest pressure, neck or arm discomfort.  She is not having any shortness of breath, PND or orthopnea.  She does get an aching or fatigue across her mid back at times.  This seems to happen sporadically and not necessarily be associated with activity.  She also has some bone density issues she is working on and wonders if that could be related. ? ? ?Past Medical History:  ?Diagnosis Date  ? MVP (mitral valve prolapse)   ? Ovarian cancer ([place])   ? SBO (small bowel obstruction) ([place])   ? ? ?Past Surgical History:  ?Procedure Laterality Date  ? ABDOMINAL EXPLORATION SURGERY  2007  ? LOA  ? ABDOMINAL HYSTERECTOMY  40 yrs ago  ? TAH  ? CHOLECYSTECTOMY    ? ERCP N/A 03/14/2013  ? Procedure: ENDOSCOPIC RETROGRADE CHOLANGIOPANCREATOGRAPHY (ERCP);  Surgeon: Missy Sabins, MD;  Location: [facility] ENDOSCOPY;  Service: Endoscopy;  Laterality: N/A;  ? ? ? ?Current Outpatient Medications  ?Medication Sig Dispense Refill  ? amLODipine  (NORVASC) 2.5 MG tablet Take 2.5 mg by mouth daily.    ? Cholecalciferol (VITAMIN D) 2000 units CAPS Take by mouth as directed.    ? Coenzyme Q10 (CO Q-10) 100 MG CAPS Take 100 mg by mouth See admin instructions.    ? Magnesium 300 MG CAPS Take by mouth as directed.    ? Menaquinone-7 (VITAMIN K2) 100 MCG CAPS Take 100 mcg by mouth See admin instructions.    ? Misc Natural Products (LUTEIN 20 PO) Take by mouth.    ? OVER THE COUNTER MEDICATION Take 1 each by mouth daily.    ? OVER THE COUNTER MEDICATION Take 4 capsules by mouth daily in the afternoon. Hibiscus Flower    ? OVER THE COUNTER MEDICATION Take 1 capsule by mouth daily in the afternoon. Olive Leaf & Oregano Immune Wellness    ? OVER THE COUNTER MEDICATION Take 1 capsule by mouth daily in the afternoon. Synergistic Eye Health    ? OVER THE COUNTER MEDICATION Take 100 mg by mouth daily in the afternoon. Ubiquinol    ? QUERCETIN PO Take 1 tablet by mouth daily.    ? B Complex-C-E-Zn (B COMPLEX-C-E-ZINC) tablet Take 1 tablet by mouth daily. (Patient not taking: Reported on 07/05/2021)    ? BIOTIN PO Take by mouth every  other day. (Patient not taking: Reported on 07/05/2021)    ? Coenzyme Q10 (CO Q 10 PO) Take 1 tablet by mouth daily. (Patient not taking: Reported on 07/05/2021)    ? ?No current facility-administered medications for this visit.  ? ? ?Allergies:   Azithromycin, Lisinopril, Other, and Sulfa antibiotics  ? ? ?Social History:  The patient  reports that she has never smoked. She has never used smokeless tobacco. She reports that she does not drink alcohol and does not use drugs.  ? ?Family History:  The patient's family history includes CAD (age of onset: 8) in her brother; Dementia in her maternal grandmother, mother, and paternal grandfather; Heart attack in her maternal grandfather; Hypertension in her father and mother; Lung cancer (age of onset: 36) in her mother; Pancreatic cancer in her cousin; Parkinson's disease in her father.  ? ? ?ROS:   Please see the history of present illness.   Otherwise, review of systems are positive for none.   All other systems are reviewed and negative.  ? ? ?PHYSICAL EXAM: ?VS:  BP 130/84   Pulse 80   Ht '5\' 7"'$  (1.702 m)   Wt 142 lb (64.4 kg)   SpO2 96%   BMI 22.24 kg/m?  , BMI Body mass index is 22.24 kg/m?. ?GENERAL:  Well appearing ?HEENT:  Pupils equal round and reactive, fundi not visualized, oral mucosa unremarkable ?NECK:  No jugular venous distention, waveform within normal limits, carotid upstroke brisk and symmetric, no bruits, no thyromegaly ?LYMPHATICS:  No cervical, inguinal adenopathy ?LUNGS:  Clear to auscultation bilaterally ?BACK:  No CVA tenderness ?CHEST:  Unremarkable ?HEART:  PMI not displaced or sustained,S1 and S2 within normal limits, no S3, no S4, no clicks, no rubs, very soft apical nonradiating systolic murmur, no diastolic murmurs ?ABD:  Flat, positive bowel sounds normal in frequency in pitch, no bruits, no rebound, no guarding, no midline pulsatile mass, no hepatomegaly, no splenomegaly ?EXT:  2 plus pulses throughout, no edema, no cyanosis no clubbing ?SKIN:  No rashes no nodules ?NEURO:  Cranial nerves II through XII grossly intact, motor grossly intact throughout ?PSYCH:  Cognitively intact, oriented to person place and time ? ? ? ?EKG:  EKG is ordered today. ?The ekg ordered today demonstrates sinus rhythm, rate 80, axis left axis deviation, inferior Q waves 3 and aVF unchanged from previous, no acute ST-T wave changes. ? ? ?Recent Labs: ?No results found for requested labs within last 8760 hours.  ? ? ?Lipid Panel ?   ?Component Value Date/Time  ? CHOL 195 03/12/2013 1248  ? TRIG 52 03/12/2013 1248  ? HDL 82 03/12/2013 1248  ? CHOLHDL 2.4 03/12/2013 1248  ? VLDL 10 03/12/2013 1248  ? LDLCALC 103 (H) 03/12/2013 1248  ? ?  ? ?Wt Readings from Last 3 Encounters:  ?07/05/21 142 lb (64.4 kg)  ?10/14/17 151 lb (68.5 kg)  ?09/23/17 151 lb 4 oz (68.6 kg)  ?  ? ? ?Other studies  Reviewed: ?Additional studies/ records that were reviewed today include: Old EKG. ?Review of the above records demonstrates:  Please see elsewhere in the note.   ? ? ?ASSESSMENT AND PLAN: ? ?MVP: She does have a slight murmur.  Given this I am going to check an echocardiogram to follow-up her mitral valve prolapse. ? ?HTN: Her blood pressure seems to be well controlled on the amlodipine and I will continue with this. ? ?Back pain: I will start with a calcium score.  However, I have a low  pretest probability suspicion of obstructive coronary disease and likely would not pursue further testing if that is unremarkable.  She did have a negative perfusion study in 2019. ? ? ? ?Current medicines are reviewed at length with the patient today.  The patient does not have concerns regarding medicines. ? ?The following changes have been made:  no change ? ?Labs/ tests ordered today include:  ? ?Orders Placed This Encounter  ?Procedures  ? CT CARDIAC SCORING (SELF PAY ONLY)  ? EKG 12-Lead  ? ECHOCARDIOGRAM COMPLETE  ? ? ? ?Disposition:   FU with as needed based on the results of the above ? ? ?Signed, ?Minus Breeding, MD  ?07/05/2021 4:48 PM    ?[place] ? ? ? ?

## 2021-07-05 ENCOUNTER — Ambulatory Visit (INDEPENDENT_AMBULATORY_CARE_PROVIDER_SITE_OTHER): Payer: PPO | Admitting: Cardiology

## 2021-07-05 ENCOUNTER — Encounter: Payer: Self-pay | Admitting: Cardiology

## 2021-07-05 VITALS — BP 130/84 | HR 80 | Ht 67.0 in | Wt 142.0 lb

## 2021-07-05 DIAGNOSIS — I341 Nonrheumatic mitral (valve) prolapse: Secondary | ICD-10-CM | POA: Diagnosis not present

## 2021-07-05 NOTE — Patient Instructions (Signed)
Medication Instructions:  ?No changes ?*If you need a refill on your cardiac medications before your next appointment, please call your pharmacy* ? ? ?Lab Work: ?None ordered ?If you have labs (blood work) drawn today and your tests are completely normal, you will receive your results only by: ?MyChart Message (if you have MyChart) OR ?A paper copy in the mail ?If you have any lab test that is abnormal or we need to change your treatment, we will call you to review the results. ? ? ?Testing/Procedures: ?Your physician has requested that you have an echocardiogram. Echocardiography is a painless test that uses sound waves to create images of your heart. It provides your doctor with information about the size and shape of your heart and how well your heart?s chambers and valves are working. You may receive an ultrasound enhancing agent through an IV if needed to better visualize your heart during the echo.This procedure takes approximately one hour. There are no restrictions for this procedure. This will take place at the [address], [address].  ? ?Dr. Percival Spanish has ordered a CT coronary calcium score.  ? ?Test locations:  ?[facility] ([address] [place], [place] [address]) ?[facility] ([address] [place], [place] [address])  ? ?This is $99 out of pocket. ? ? ?Coronary CalciumScan ?A coronary calcium scan is an imaging test used to look for deposits of calcium and other fatty materials (plaques) in the inner lining of the blood vessels of the heart (coronary arteries). These deposits of calcium and plaques can partly clog and narrow the coronary arteries without producing any symptoms or warning signs. This puts a person at risk for a heart attack. This test can detect these deposits before symptoms develop. ?Tell a health care provider about: ?Any allergies you have. ?All medicines you are taking, including vitamins, herbs, eye drops, creams, and over-the-counter medicines. ?Any  problems you or family members have had with anesthetic medicines. ?Any blood disorders you have. ?Any surgeries you have had. ?Any medical conditions you have. ?Whether you are pregnant or may be pregnant. ?What are the risks? ?Generally, this is a safe procedure. However, problems may occur, including: ?Harm to a pregnant woman and her unborn baby. This test involves the use of radiation. Radiation exposure can be dangerous to a pregnant woman and her unborn baby. If you are pregnant, you generally should not have this procedure done. ?Slight increase in the risk of cancer. This is because of the radiation involved in the test. ?What happens before the procedure? ?No preparation is needed for this procedure. ?What happens during the procedure? ?You will undress and remove any jewelry around your neck or chest. ?You will put on a hospital gown. ?Sticky electrodes will be placed on your chest. The electrodes will be connected to an electrocardiogram (ECG) machine to record a tracing of the electrical activity of your heart. ?A CT scanner will take pictures of your heart. During this time, you will be asked to lie still and hold your breath for 2-3 seconds while a picture of your heart is being taken. ?The procedure may vary among health care providers and hospitals. ?What happens after the procedure? ?You can get dressed. ?You can return to your normal activities. ?It is up to you to get the results of your test. Ask your health care provider, or the department that is doing the test, when your results will be ready. ?Summary ?A coronary calcium scan is an imaging test used to  look for deposits of calcium and other fatty materials (plaques) in the inner lining of the blood vessels of the heart (coronary arteries). ?Generally, this is a safe procedure. Tell your health care provider if you are pregnant or may be pregnant. ?No preparation is needed for this procedure. ?A CT scanner will take pictures of your  heart. ?You can return to your normal activities after the scan is done. ?This information is not intended to replace advice given to you by your health care provider. Make sure you discuss any questions you have with your health care provider. ?Document Released: 09/21/2007 Document Revised: 02/12/2016 Document Reviewed: 02/12/2016 ?Elsevier Interactive Patient Education ? 2017 Elsevier Inc. ? ? ? ?Follow-Up: ?At [facility], you and your health needs are our priority.  As part of our continuing mission to provide you with exceptional heart care, we have created designated Provider Care Teams.  These Care Teams include your primary Cardiologist (physician) and Advanced Practice Providers (APPs -  Physician Assistants and Nurse Practitioners) who all work together to provide you with the care you need, when you need it. ? ?We recommend signing up for the patient portal called "MyChart".  Sign up information is provided on this After Visit Summary.  MyChart is used to connect with patients for Virtual Visits (Telemedicine).  Patients are able to view lab/test results, encounter notes, upcoming appointments, etc.  Non-urgent messages can be sent to your provider as well.   ?To learn more about what you can do with MyChart, go to [email or web address].   ? ?Your next appointment:   ?Follow up pending results of tests ? ?

## 2021-07-12 DIAGNOSIS — I10 Essential (primary) hypertension: Secondary | ICD-10-CM | POA: Diagnosis not present

## 2021-07-24 ENCOUNTER — Ambulatory Visit
Admission: RE | Admit: 2021-07-24 | Discharge: 2021-07-24 | Disposition: A | Discharge status: Home / Self Care | Payer: Self-pay | Source: Ambulatory Visit | Attending: Cardiology | Admitting: Cardiology

## 2021-07-24 ENCOUNTER — Ambulatory Visit (HOSPITAL_COMMUNITY): Payer: PPO | Attending: Cardiology

## 2021-07-24 DIAGNOSIS — I341 Nonrheumatic mitral (valve) prolapse: Secondary | ICD-10-CM | POA: Insufficient documentation

## 2021-07-24 LAB — ECHOCARDIOGRAM COMPLETE
Area-P 1/2: 3.72 cm2
S' Lateral: 3 cm

## 2021-08-02 ENCOUNTER — Encounter: Payer: Self-pay | Admitting: *Deleted

## 2021-08-12 DIAGNOSIS — R931 Abnormal findings on diagnostic imaging of heart and coronary circulation: Secondary | ICD-10-CM | POA: Insufficient documentation

## 2021-08-12 DIAGNOSIS — I10 Essential (primary) hypertension: Secondary | ICD-10-CM | POA: Insufficient documentation

## 2021-08-12 NOTE — Progress Notes (Signed)
?  ? ? ?Cardiology Office Note ? ? ?Date:  08/13/2021  ? ?ID:  Zoe Jackson, DOB 02-Mar-1946, MRN [id number] ? ?PCP:  Harlan Stains, MD  ?Cardiologist:   Minus Breeding, MD ?Referring:  Harlan Stains, MD ? ?Chief Complaint  ?Patient presents with  ? Elevated Coronary Calcium  ? ? ?  ?History of Present Illness: ?Zoe Jackson is a 76 y.o. female who presents for hypertension and back pain.   I saw her last in 2019.  She had mild MVP and had a normal EF.  Nuclear stress test was negative.   She was having some SOB and chest pressure when I saw her in March.  Follow up echo demonstrated no MVP.  She had a mildly elevated calcium score of 50.3 which was 52nd percentile.   ? ?She came back to discuss this today.  Her 10-year risk for cardiovascular events with the calcium is 5.9%.  The patient denies any new symptoms such as chest discomfort, neck or arm discomfort. There has been no new shortness of breath, PND or orthopnea. There have been no reported palpitations, presyncope or syncope. She is very active.  ? ? ?Past Medical History:  ?Diagnosis Date  ? MVP (mitral valve prolapse)   ? Ovarian cancer ([place])   ? SBO (small bowel obstruction) ([place])   ? ? ?Past Surgical History:  ?Procedure Laterality Date  ? ABDOMINAL EXPLORATION SURGERY  2007  ? LOA  ? ABDOMINAL HYSTERECTOMY  40 yrs ago  ? TAH  ? CHOLECYSTECTOMY    ? ERCP N/A 03/14/2013  ? Procedure: ENDOSCOPIC RETROGRADE CHOLANGIOPANCREATOGRAPHY (ERCP);  Surgeon: Missy Sabins, MD;  Location: [facility] ENDOSCOPY;  Service: Endoscopy;  Laterality: N/A;  ? ? ? ?Current Outpatient Medications  ?Medication Sig Dispense Refill  ? amLODipine (NORVASC) 2.5 MG tablet Take 2.5 mg by mouth daily.    ? B Complex-C-E-Zn (B COMPLEX-C-E-ZINC) tablet Take 1 tablet by mouth daily.    ? BIOTIN PO Take by mouth every other day.    ? Cholecalciferol (VITAMIN D) 2000 units CAPS Take by mouth as directed.    ? Coenzyme Q10 (CO Q 10 PO) Take 1 tablet by mouth daily.    ? Coenzyme Q10 (CO  Q-10) 100 MG CAPS Take 100 mg by mouth See admin instructions.    ? Magnesium 300 MG CAPS Take by mouth as directed.    ? Menaquinone-7 (VITAMIN K2) 100 MCG CAPS Take 100 mcg by mouth See admin instructions.    ? Misc Natural Products (LUTEIN 20 PO) Take by mouth.    ? OVER THE COUNTER MEDICATION Take 1 each by mouth daily.    ? OVER THE COUNTER MEDICATION Take 4 capsules by mouth daily in the afternoon. Hibiscus Flower    ? OVER THE COUNTER MEDICATION Take 1 capsule by mouth daily in the afternoon. Olive Leaf & Oregano Immune Wellness    ? OVER THE COUNTER MEDICATION Take 1 capsule by mouth daily in the afternoon. Synergistic Eye Health    ? OVER THE COUNTER MEDICATION Take 100 mg by mouth daily in the afternoon. Ubiquinol    ? QUERCETIN PO Take 1 tablet by mouth daily.    ? ?No current facility-administered medications for this visit.  ? ? ?Allergies:   Azithromycin, Lisinopril, Other, and Sulfa antibiotics  ? ? ?ROS:  Please see the history of present illness.   Otherwise, review of systems are positive for none.   All other systems are reviewed and negative.  ? ? ?  PHYSICAL EXAM: ?VS:  BP 128/76   Pulse 69   Ht '5\' 6"'$  (1.676 m)   Wt 144 lb (65.3 kg)   SpO2 98%   BMI 23.24 kg/m?  , BMI Body mass index is 23.24 kg/m?. ?GENERAL:  Well appearing ?NECK:  No jugular venous distention, waveform within normal limits, carotid upstroke brisk and symmetric, no bruits, no thyromegaly ?LUNGS:  Clear to auscultation bilaterally ?CHEST:  Unremarkable ?HEART:  PMI not displaced or sustained,S1 and S2 within normal limits, no S3, no S4, no clicks, no rubs, very soft apical systolic murmur nonradiating, no diastolic murmurs ?ABD:  Flat, positive bowel sounds normal in frequency in pitch, no bruits, no rebound, no guarding, no midline pulsatile mass, no hepatomegaly, no splenomegaly ?EXT:  2 plus pulses throughout, no edema, no cyanosis no clubbing ? ? ?EKG:  EKG is not ordered today. ?The ekg ordered today demonstrates sinus  rhythm, rate 30, axis left axis deviation, inferior Q waves 3 and aVF unchanged from previous, no acute ST-T wave changes. ? ? ?Recent Labs: ?No results found for requested labs within last 8760 hours.  ? ? ?Lipid Panel ?   ?Component Value Date/Time  ? CHOL 195 03/12/2013 1248  ? TRIG 52 03/12/2013 1248  ? HDL 82 03/12/2013 1248  ? CHOLHDL 2.4 03/12/2013 1248  ? VLDL 10 03/12/2013 1248  ? LDLCALC 103 (H) 03/12/2013 1248  ? ?  ? ?Wt Readings from Last 3 Encounters:  ?08/13/21 144 lb (65.3 kg)  ?07/05/21 142 lb (64.4 kg)  ?10/14/17 151 lb (68.5 kg)  ?  ? ? ?Other studies Reviewed: ?Additional studies/ records that were reviewed today include: Echo and labs ?Review of the above records demonstrates:  Please see elsewhere in the note.   ? ? ?ASSESSMENT AND PLAN: ? ? ?MVP:    She has no significant regurgitation on echo.  No change in therapy.  ? ?HTN: Her blood pressure is at target.  No change in therapy.  ? ?Elevated coronary calcium score: We had a long conversation about her MESA score.  At this point there is no absolute indication for statin or aspirin and she would prefer not to take these but rather work on lifestyle.  I think this is very reasonable.  No further testing. ? ? ? ?Current medicines are reviewed at length with the patient today.  The patient does not have concerns regarding medicines. ? ?The following changes have been made:  None ? ?Labs/ tests ordered today include: None  ? ?No orders of the defined types were placed in this encounter. ? ? ? ?Disposition:   FU with as me needed.    ? ? ?Signed, ?Minus Breeding, MD  ?08/13/2021 10:59 AM    ?[place] ? ? ? ?

## 2021-08-13 ENCOUNTER — Ambulatory Visit (INDEPENDENT_AMBULATORY_CARE_PROVIDER_SITE_OTHER): Payer: PPO | Admitting: Cardiology

## 2021-08-13 ENCOUNTER — Encounter: Payer: Self-pay | Admitting: Cardiology

## 2021-08-13 VITALS — BP 128/76 | HR 69 | Ht 66.0 in | Wt 144.0 lb

## 2021-08-13 DIAGNOSIS — R931 Abnormal findings on diagnostic imaging of heart and coronary circulation: Secondary | ICD-10-CM | POA: Diagnosis not present

## 2021-08-13 DIAGNOSIS — I10 Essential (primary) hypertension: Secondary | ICD-10-CM

## 2021-08-13 DIAGNOSIS — I341 Nonrheumatic mitral (valve) prolapse: Secondary | ICD-10-CM

## 2021-08-13 NOTE — Patient Instructions (Signed)
Medication Instructions:  ?Your physician recommends that you continue on your current medications as directed. Please refer to the Current Medication list given to you today. ? ?*If you need a refill on your cardiac medications before your next appointment, please call your pharmacy* ? ?Lab Work: ?NONE ordered at this time of appointment  ? ?If you have labs (blood work) drawn today and your tests are completely normal, you will receive your results only by: ?MyChart Message (if you have MyChart) OR ?A paper copy in the mail ?If you have any lab test that is abnormal or we need to change your treatment, we will call you to review the results. ? ?Testing/Procedures: ?NONE ordered at this time of appointment  ? ?Follow-Up: ?At [facility], you and your health needs are our priority.  As part of our continuing mission to provide you with exceptional heart care, we have created designated Provider Care Teams.  These Care Teams include your primary Cardiologist (physician) and Advanced Practice Providers (APPs -  Physician Assistants and Nurse Practitioners) who all work together to provide you with the care you need, when you need it. ? ? ?Your next appointment:   ?As needed  ? ?The format for your next appointment:   ?In Person ? ?Provider:   ?Minus Breeding, MD   ? ?Other Instructions ? ? ?Important Information About Sugar ? ? ? ? ? ? ?

## 2021-08-21 DIAGNOSIS — L309 Dermatitis, unspecified: Secondary | ICD-10-CM | POA: Diagnosis not present

## 2021-08-21 DIAGNOSIS — L728 Other follicular cysts of the skin and subcutaneous tissue: Secondary | ICD-10-CM | POA: Diagnosis not present

## 2021-08-30 DIAGNOSIS — L718 Other rosacea: Secondary | ICD-10-CM | POA: Diagnosis not present

## 2021-08-30 DIAGNOSIS — L728 Other follicular cysts of the skin and subcutaneous tissue: Secondary | ICD-10-CM | POA: Diagnosis not present

## 2021-09-19 ENCOUNTER — Ambulatory Visit
Admission: RE | Admit: 2021-09-19 | Discharge: 2021-09-19 | Disposition: A | Discharge status: Home / Self Care | Payer: PPO | Source: Ambulatory Visit | Attending: Family Medicine | Admitting: Family Medicine

## 2021-09-19 DIAGNOSIS — Z780 Asymptomatic menopausal state: Secondary | ICD-10-CM | POA: Diagnosis not present

## 2021-09-19 DIAGNOSIS — M8589 Other specified disorders of bone density and structure, multiple sites: Secondary | ICD-10-CM | POA: Diagnosis not present

## 2021-09-19 DIAGNOSIS — Z1231 Encounter for screening mammogram for malignant neoplasm of breast: Secondary | ICD-10-CM | POA: Diagnosis not present

## 2021-09-19 DIAGNOSIS — M8580 Other specified disorders of bone density and structure, unspecified site: Secondary | ICD-10-CM

## 2021-11-22 DIAGNOSIS — L718 Other rosacea: Secondary | ICD-10-CM | POA: Diagnosis not present

## 2021-11-22 DIAGNOSIS — D225 Melanocytic nevi of trunk: Secondary | ICD-10-CM | POA: Diagnosis not present

## 2021-11-22 DIAGNOSIS — L821 Other seborrheic keratosis: Secondary | ICD-10-CM | POA: Diagnosis not present

## 2021-11-22 DIAGNOSIS — L304 Erythema intertrigo: Secondary | ICD-10-CM | POA: Diagnosis not present

## 2021-11-22 DIAGNOSIS — L814 Other melanin hyperpigmentation: Secondary | ICD-10-CM | POA: Diagnosis not present

## 2021-11-22 DIAGNOSIS — L570 Actinic keratosis: Secondary | ICD-10-CM | POA: Diagnosis not present

## 2022-02-05 DIAGNOSIS — E559 Vitamin D deficiency, unspecified: Secondary | ICD-10-CM | POA: Diagnosis not present

## 2022-02-05 DIAGNOSIS — Z23 Encounter for immunization: Secondary | ICD-10-CM | POA: Diagnosis not present

## 2022-02-05 DIAGNOSIS — I700 Atherosclerosis of aorta: Secondary | ICD-10-CM | POA: Diagnosis not present

## 2022-02-05 DIAGNOSIS — Z0000 Encounter for general adult medical examination without abnormal findings: Secondary | ICD-10-CM | POA: Diagnosis not present

## 2022-02-05 DIAGNOSIS — M8588 Other specified disorders of bone density and structure, other site: Secondary | ICD-10-CM | POA: Diagnosis not present

## 2022-02-05 DIAGNOSIS — I10 Essential (primary) hypertension: Secondary | ICD-10-CM | POA: Diagnosis not present

## 2022-07-21 IMAGING — MG MM DIGITAL SCREENING BILAT W/ TOMO AND CAD
8 series · 9 of 24 positions shown · non-contrast
Comparison: Previous exam(s).

CLINICAL DATA: Screening.

EXAM:
DIGITAL SCREENING BILATERAL MAMMOGRAM WITH TOMOSYNTHESIS AND CAD
TECHNIQUE: Bilateral screening digital craniocaudal and mediolateral oblique
mammograms were obtained. Bilateral screening digital breast
tomosynthesis was performed. The images were evaluated with
computer-aided detection.

[L MLO synth-2D]
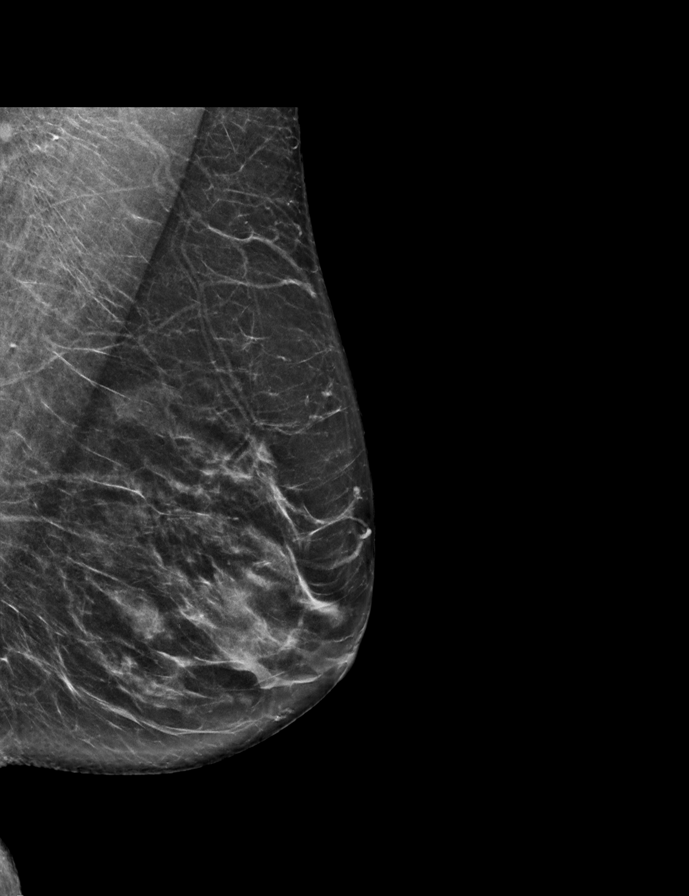

[R MLO synth-2D]
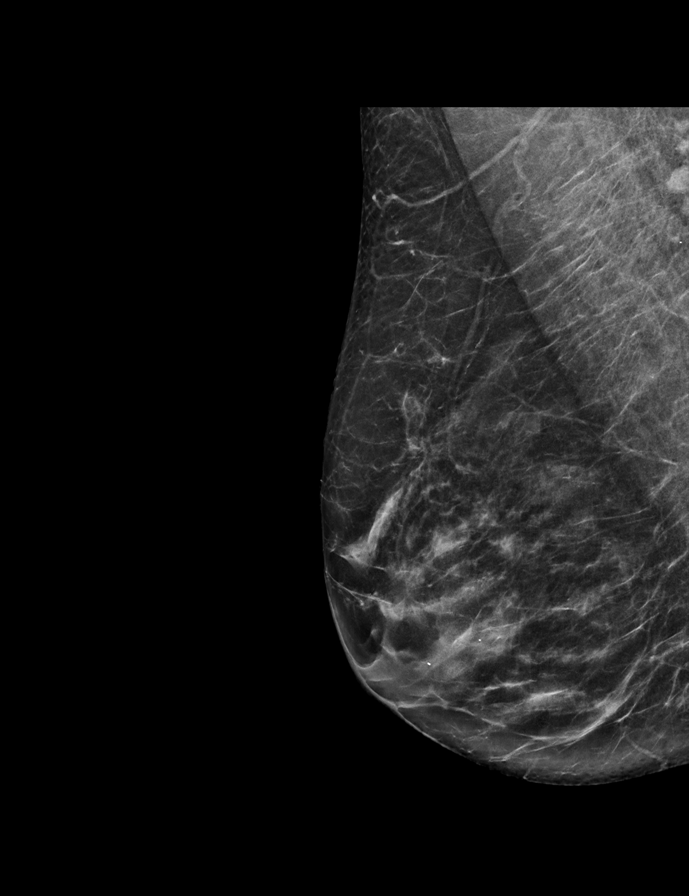

[L CC synth-2D]
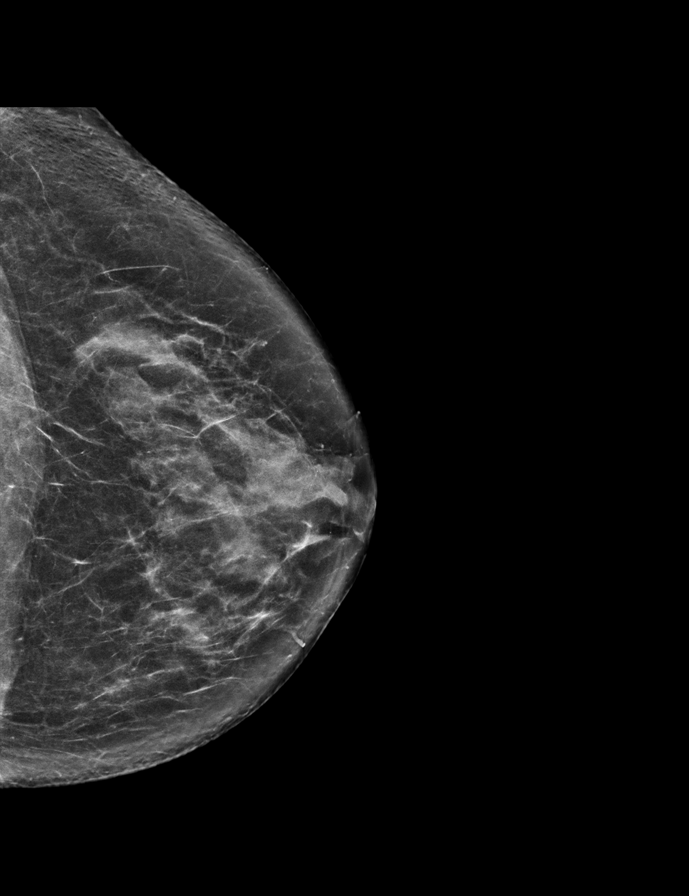

[R CC synth-2D]
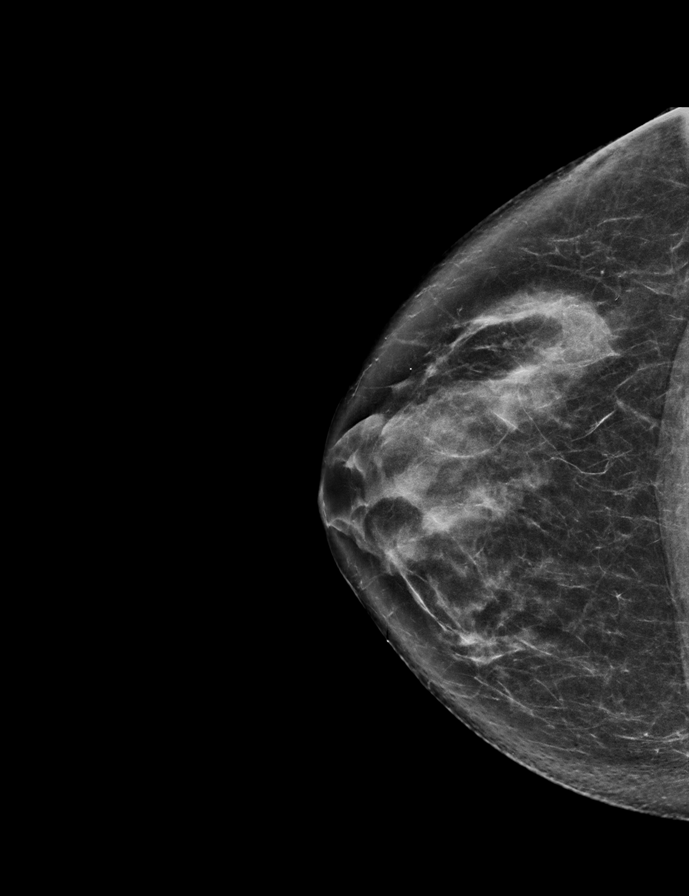

[L CC tomo · 2 of 68 frames shown]
[frame 22/68]
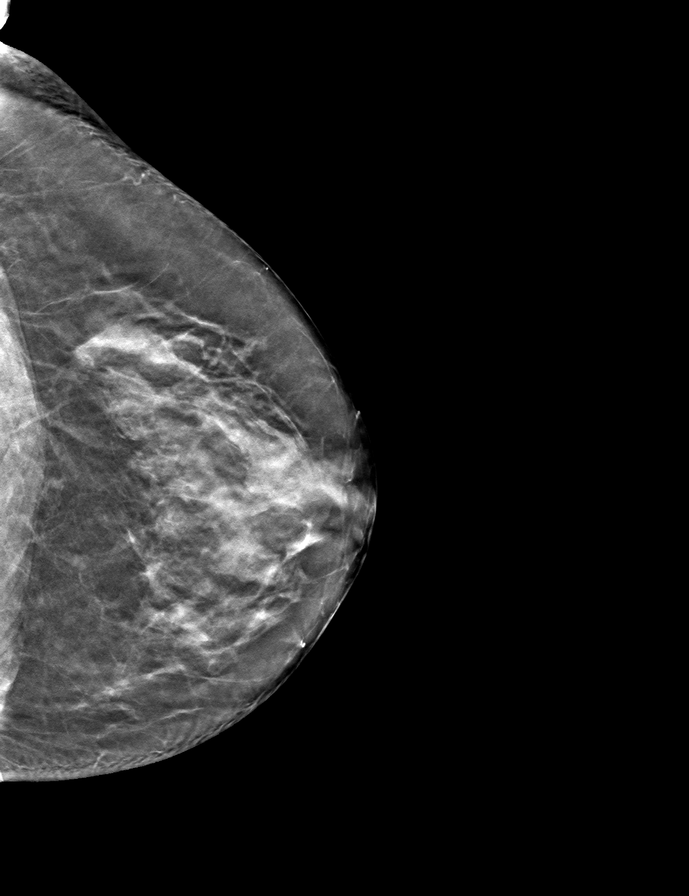
[frame 35/68]
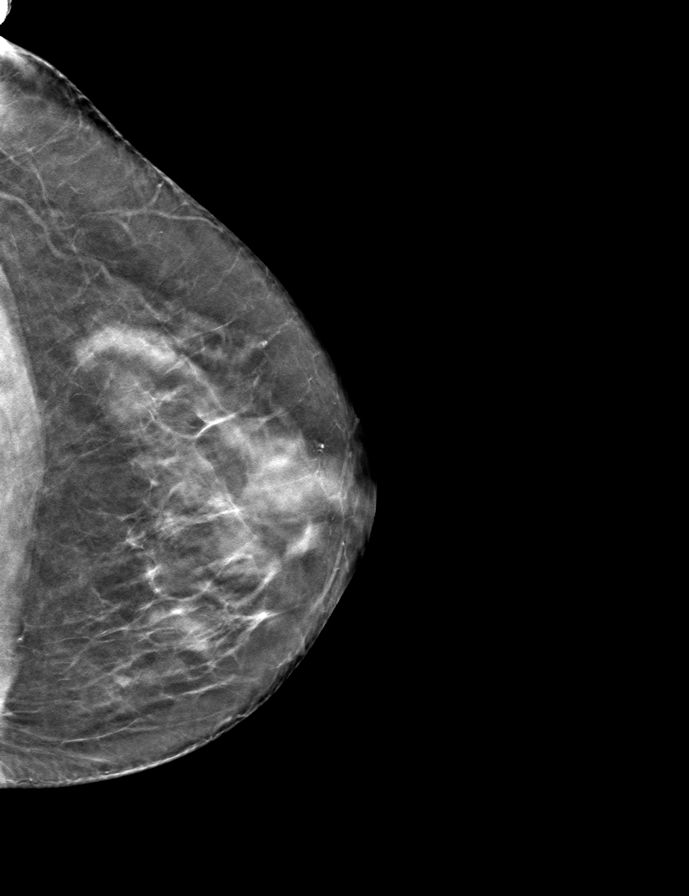

[R CC tomo · tomo slice 35/69.0]
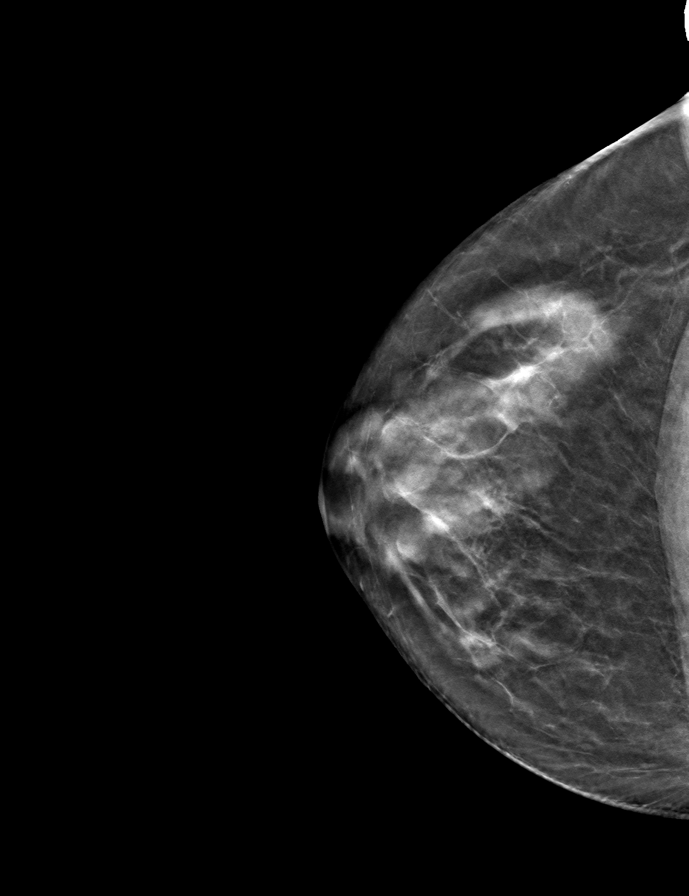

[L MLO tomo · tomo slice 34/67.0]
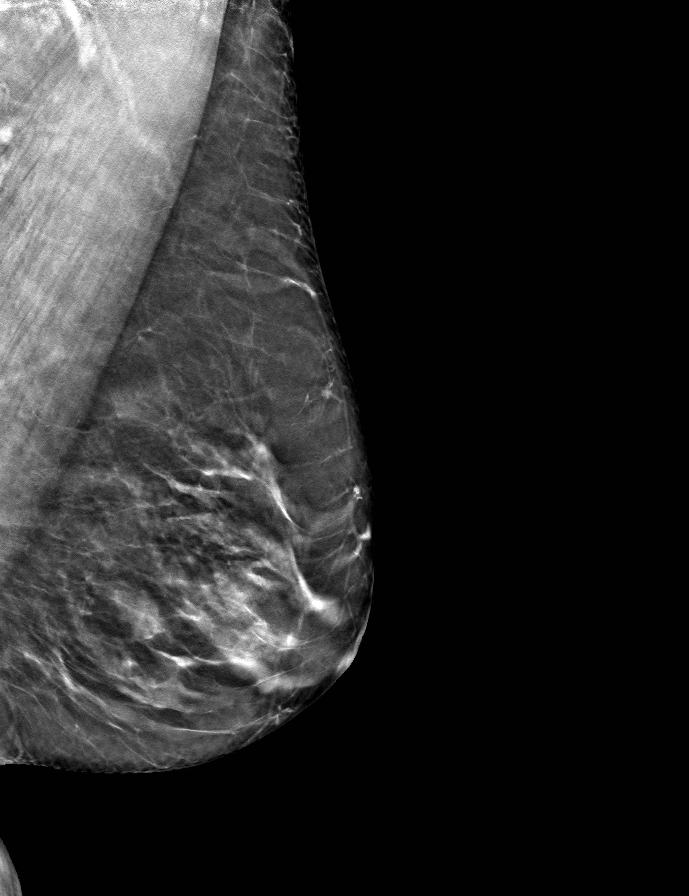

[R MLO tomo · tomo slice 33/64.0]
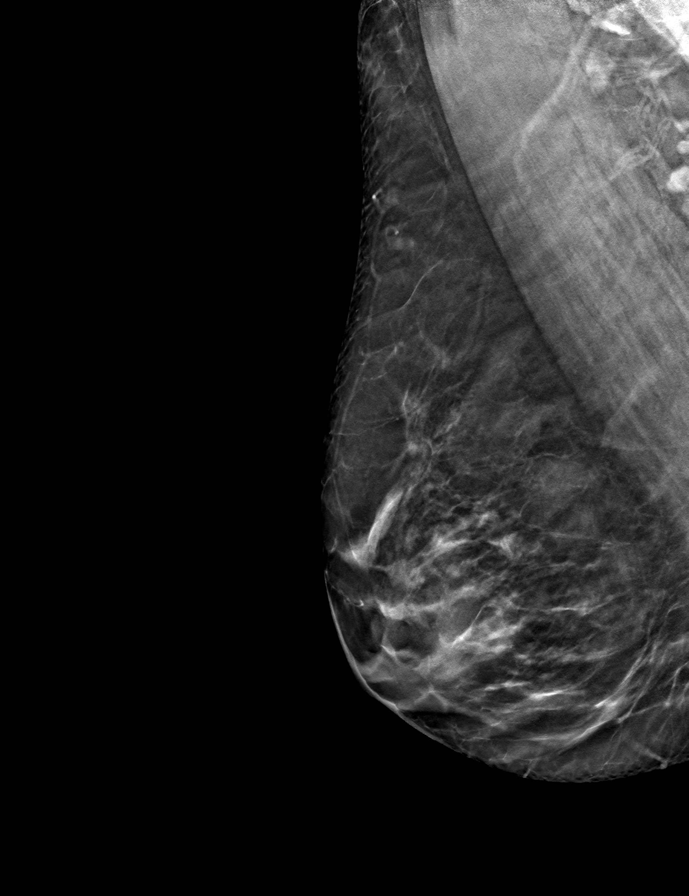

[9 of 24 positions shown; findings below may reference images not displayed]

ACR Breast Density Category c: The breast tissue is heterogeneously
dense, which may obscure small masses.
FINDINGS: There are no findings suspicious for malignancy.
IMPRESSION: No mammographic evidence of malignancy. A result letter of this
screening mammogram will be mailed directly to the patient.

RECOMMENDATION:
Screening mammogram in one year. (Code:Q3-W-BC3)

BI-RADS CATEGORY  1: Negative.

## 2022-08-08 DIAGNOSIS — F439 Reaction to severe stress, unspecified: Secondary | ICD-10-CM | POA: Diagnosis not present

## 2022-08-08 DIAGNOSIS — I700 Atherosclerosis of aorta: Secondary | ICD-10-CM | POA: Diagnosis not present

## 2022-08-08 DIAGNOSIS — R0600 Dyspnea, unspecified: Secondary | ICD-10-CM | POA: Diagnosis not present

## 2022-08-08 DIAGNOSIS — I10 Essential (primary) hypertension: Secondary | ICD-10-CM | POA: Diagnosis not present

## 2022-08-08 DIAGNOSIS — R1319 Other dysphagia: Secondary | ICD-10-CM | POA: Diagnosis not present

## 2022-08-08 DIAGNOSIS — D692 Other nonthrombocytopenic purpura: Secondary | ICD-10-CM | POA: Diagnosis not present

## 2022-08-13 DIAGNOSIS — R0789 Other chest pain: Secondary | ICD-10-CM | POA: Diagnosis not present

## 2022-08-31 ENCOUNTER — Encounter (HOSPITAL_COMMUNITY): Payer: Self-pay

## 2022-08-31 ENCOUNTER — Emergency Department (HOSPITAL_COMMUNITY): Payer: PPO

## 2022-08-31 ENCOUNTER — Emergency Department (HOSPITAL_COMMUNITY)
Admission: EM | Admit: 2022-08-31 | Discharge: 2022-08-31 | Disposition: A | Discharge status: Home / Self Care | Payer: PPO | Attending: Emergency Medicine | Admitting: Emergency Medicine

## 2022-08-31 ENCOUNTER — Other Ambulatory Visit: Payer: Self-pay

## 2022-08-31 DIAGNOSIS — I10 Essential (primary) hypertension: Secondary | ICD-10-CM | POA: Insufficient documentation

## 2022-08-31 DIAGNOSIS — R1013 Epigastric pain: Secondary | ICD-10-CM | POA: Diagnosis present

## 2022-08-31 DIAGNOSIS — K2900 Acute gastritis without bleeding: Secondary | ICD-10-CM

## 2022-08-31 DIAGNOSIS — R911 Solitary pulmonary nodule: Secondary | ICD-10-CM | POA: Diagnosis not present

## 2022-08-31 DIAGNOSIS — R079 Chest pain, unspecified: Secondary | ICD-10-CM | POA: Diagnosis not present

## 2022-08-31 DIAGNOSIS — R0789 Other chest pain: Secondary | ICD-10-CM | POA: Diagnosis not present

## 2022-08-31 DIAGNOSIS — Z79899 Other long term (current) drug therapy: Secondary | ICD-10-CM | POA: Diagnosis not present

## 2022-08-31 DIAGNOSIS — R109 Unspecified abdominal pain: Secondary | ICD-10-CM | POA: Diagnosis not present

## 2022-08-31 DIAGNOSIS — Z8543 Personal history of malignant neoplasm of ovary: Secondary | ICD-10-CM | POA: Diagnosis not present

## 2022-08-31 LAB — URINALYSIS, W/ REFLEX TO CULTURE (INFECTION SUSPECTED)
Bacteria, UA: NONE SEEN
Bilirubin Urine: NEGATIVE
Glucose, UA: NEGATIVE mg/dL
Hgb urine dipstick: NEGATIVE
Ketones, ur: NEGATIVE mg/dL
Leukocytes,Ua: NEGATIVE
Nitrite: NEGATIVE
Protein, ur: NEGATIVE mg/dL
Specific Gravity, Urine: 1.008 (ref 1.005–1.030)
pH: 5 (ref 5.0–8.0)

## 2022-08-31 LAB — BASIC METABOLIC PANEL
Anion gap: 9 (ref 5–15)
BUN: 10 mg/dL (ref 8–23)
CO2: 25 mmol/L (ref 22–32)
Calcium: 8.9 mg/dL (ref 8.9–10.3)
Chloride: 103 mmol/L (ref 98–111)
Creatinine, Ser: 0.68 mg/dL (ref 0.44–1.00)
GFR, Estimated: >60 mL/min (ref >60)
Glucose, Bld: 103 mg/dL — ABNORMAL HIGH (ref 70–99)
Potassium: 3.7 mmol/L (ref 3.5–5.1)
Sodium: 137 mmol/L (ref 135–145)

## 2022-08-31 LAB — LIPASE, BLOOD: Lipase: 37 U/L (ref 11–51)

## 2022-08-31 LAB — HEPATIC FUNCTION PANEL
ALT: 18 U/L (ref 0–44)
AST: 22 U/L (ref 15–41)
Albumin: 4 g/dL (ref 3.5–5.0)
Alkaline Phosphatase: 61 U/L (ref 38–126)
Bilirubin, Direct: 0.1 mg/dL (ref 0.0–0.2)
Indirect Bilirubin: 0.7 mg/dL (ref 0.3–0.9)
Total Bilirubin: 0.8 mg/dL (ref 0.3–1.2)
Total Protein: 6.8 g/dL (ref 6.5–8.1)

## 2022-08-31 LAB — CBC
HCT: 42.6 % (ref 36.0–46.0)
Hemoglobin: 14.3 g/dL (ref 12.0–15.0)
MCH: 30.6 pg (ref 26.0–34.0)
MCHC: 33.6 g/dL (ref 30.0–36.0)
MCV: 91.2 fL (ref 80.0–100.0)
Platelets: 210 10*3/uL (ref 150–400)
RBC: 4.67 MIL/uL (ref 3.87–5.11)
RDW: 12.9 % (ref 11.5–15.5)
WBC: 4.2 10*3/uL (ref 4.0–10.5)
nRBC: 0 % (ref 0.0–0.2)

## 2022-08-31 LAB — TROPONIN I (HIGH SENSITIVITY)
Troponin I (High Sensitivity): 2 ng/L (ref <18)
Troponin I (High Sensitivity): 2 ng/L (ref <18)

## 2022-08-31 MED ORDER — OMEPRAZOLE 20 MG PO CPDR: 20.0000 mg | DELAYED_RELEASE_CAPSULE | Freq: Every day | ORAL | 0 refills | Status: DC

## 2022-08-31 MED ORDER — SODIUM CHLORIDE 0.9 % IV BOLUS
500.0000 mL | Freq: Once | INTRAVENOUS | Status: AC
  Administered 2022-08-31: 500 mL via INTRAVENOUS

## 2022-08-31 MED ORDER — PANTOPRAZOLE SODIUM 40 MG IV SOLR
40.0000 mg | Freq: Once | INTRAVENOUS | Status: AC
  Administered 2022-08-31: 40 mg via INTRAVENOUS
  Filled 2022-08-31: qty 10

## 2022-08-31 MED ORDER — IOHEXOL 300 MG/ML  SOLN
100.0000 mL | Freq: Once | INTRAMUSCULAR | Status: AC | PRN
  Administered 2022-08-31: 100 mL via INTRAVENOUS

## 2022-08-31 NOTE — ED Triage Notes (Signed)
Pt. Arrives POV c/o epigastric pain and substernal chest pain x2 weeks. Pt. States that she was seen at urgent care and they did an EKG. She was told that the EKG was fine. They told her to come to the ED for blood work. Pt. States that she didn't feel like she needed to come to the ED so she didn't. Today she the pain came back and she felt a little worse. She stated that she took a tums and had some relief.

## 2022-08-31 NOTE — ED Provider Notes (Signed)
[place] EMERGENCY DEPARTMENT AT [facility] Provider Note   CSN: [id number] Arrival date & time: 08/31/22  1859     History  Chief Complaint  Patient presents with   Chest Pain    Zoe Jackson is a 77 y.o. female.  Patient is a 77 year old female who has a history of hypertension.  She had a remote history of ovarian cancer.  She also is status postcholecystectomy.  She reports that about 2 weeks ago she inhaled some Clorox fumes and had a bronchitis type flare with some irritation in her throat and esophagus area.  Since that time she has had a discomfort in the epigastric/lower chest area.  She has some associated discomfort in her mid back area at the same level of the epigastrium.  However she has had that similar back pain when she is walked in the past.  There is no pain around her shoulder blades.  No other chest pain.  She denies any exertional symptoms.  She says the discomfort is intermittent.  Is not related to eating.  Not related to breathing.  Not related to exertion.  She did take a natural Tums like medication and she said the symptoms resolved.  However today she felt very fatigued.  She denies any fevers.  No cough or cold symptoms.  No urinary symptoms.  No leg pain or swelling.  No history of known heart disease.       Home Medications Prior to Admission medications   Medication Sig Start Date End Date Taking? Authorizing Provider  omeprazole (PRILOSEC) 20 MG capsule Take 1 capsule (20 mg total) by mouth daily. 08/31/22  Yes Rolan Bucco, MD  amLODipine (NORVASC) 2.5 MG tablet Take 2.5 mg by mouth daily. 06/19/21   [provider]  B Complex-C-E-Zn (B COMPLEX-C-E-ZINC) tablet Take 1 tablet by mouth daily.    [provider]  BIOTIN PO Take by mouth every other day.    [provider]  Cholecalciferol (VITAMIN D) 2000 units CAPS Take by mouth as directed.    [provider]  Coenzyme Q10 (CO Q 10 PO) Take 1  tablet by mouth daily.    [provider]  Coenzyme Q10 (CO Q-10) 100 MG CAPS Take 100 mg by mouth See admin instructions.    [provider]  Magnesium 300 MG CAPS Take by mouth as directed.    [provider]  Menaquinone-7 (VITAMIN K2) 100 MCG CAPS Take 100 mcg by mouth See admin instructions.    [provider]  Misc Natural Products (LUTEIN 20 PO) Take by mouth.    [provider]  OVER THE COUNTER MEDICATION Take 1 each by mouth daily.    [provider]  OVER THE COUNTER MEDICATION Take 4 capsules by mouth daily in the afternoon. Hibiscus Flower    [provider]  OVER THE COUNTER MEDICATION Take 1 capsule by mouth daily in the afternoon. Olive Leaf & Oregano Immune Wellness    [provider]  OVER THE COUNTER MEDICATION Take 1 capsule by mouth daily in the afternoon. Sy[employer]    [provider]  OVER THE COUNTER MEDICATION Take 100 mg by mouth daily in the afternoon. Ubiquinol    [provider]  QUERCETIN PO Take 1 tablet by mouth daily.    [provider]      Allergies    Azithromycin, Lisinopril, Other, and Sulfa antibiotics    Review of Systems   Review  of Systems  Constitutional:  Positive for fatigue. Negative for chills, diaphoresis and fever.  HENT:  Negative for congestion, rhinorrhea and sneezing.   Eyes: Negative.   Respiratory:  Negative for cough, chest tightness and shortness of breath.   Cardiovascular:  Positive for chest pain (Epigastric/lower chest pain). Negative for leg swelling.  Gastrointestinal:  Positive for abdominal pain. Negative for blood in stool, diarrhea, nausea and vomiting.  Genitourinary:  Negative for difficulty urinating, flank pain, frequency and hematuria.  Musculoskeletal:  Positive for back pain. Negative for arthralgias.  Skin:  Negative for rash.  Neurological:  Negative for dizziness, speech difficulty, weakness, numbness and  headaches.    Physical Exam Updated Vital Signs BP (!) 151/72   Pulse 77   Temp 98 F (36.7 C) (Oral)   Resp (!) 22   SpO2 100%  Physical Exam Constitutional:      Appearance: She is well-developed.  HENT:     Head: Normocephalic and atraumatic.  Eyes:     Pupils: Pupils are equal, round, and reactive to light.  Cardiovascular:     Rate and Rhythm: Normal rate and regular rhythm.     Heart sounds: Normal heart sounds.  Pulmonary:     Effort: Pulmonary effort is normal. No respiratory distress.     Breath sounds: Normal breath sounds. No wheezing or rales.  Chest:     Chest wall: No tenderness.  Abdominal:     General: Bowel sounds are normal.     Palpations: Abdomen is soft.     Tenderness: There is no abdominal tenderness. There is no guarding or rebound.  Musculoskeletal:        General: Normal range of motion.     Cervical back: Normal range of motion and neck supple.     Comments: No edema or calf tenderness  Lymphadenopathy:     Cervical: No cervical adenopathy.  Skin:    General: Skin is warm and dry.     Findings: No rash.  Neurological:     Mental Status: She is alert and oriented to person, place, and time.     ED Results / Procedures / Treatments   Labs (all labs ordered are listed, but only abnormal results are displayed) Labs Reviewed  BASIC METABOLIC PANEL - Abnormal; Notable for the following components:      Result Value   Glucose, Bld 103 (*)    All other components within normal limits  CBC  LIPASE, BLOOD  URINALYSIS, W/ REFLEX TO CULTURE (INFECTION SUSPECTED)  HEPATIC FUNCTION PANEL  TROPONIN I (HIGH SENSITIVITY)  TROPONIN I (HIGH SENSITIVITY)    EKG EKG Interpretation  Date/Time:  Saturday Aug 31 2022 19:13:19 EDT Ventricular Rate:  81 PR Interval:  157 QRS Duration: 80 QT Interval:  359 QTC Calculation: 417 R Axis:   -10 Text Interpretation: Sinus rhythm Probable left atrial enlargement Anterior infarct, old similar to prior  EKGs Confirmed by Rolan Bucco [phone]) on 08/31/2022 7:56:49 PM  Radiology CT ABDOMEN PELVIS W CONTRAST  Result Date: 08/31/2022 CLINICAL DATA:  Abdominal pain, acute, nonlocalized epigastric pain and substernal chest pain x2 weeks. EXAM: CT ABDOMEN AND PELVIS WITH CONTRAST TECHNIQUE: Multidetector CT imaging of the abdomen and pelvis was performed using the standard protocol following bolus administration of intravenous contrast. RADIATION DOSE REDUCTION: This exam was performed according to the departmental dose-optimization program which includes automated exposure control, adjustment of the mA and/or kV according to patient size and/or use of iterative reconstruction technique. CONTRAST:   OMNIPAQUE IOHEXOL 300 MG/ML  SOLN COMPARISON:  CT cardiac 07/24/2021, MRI abdomen 03/13/2013 FINDINGS: Lower chest: Interval development of cluster of pulmonary nodules within the right lower lobe measuring up to 6 x 8 mm. Hepatobiliary: Vague heterogeneous peripherally enhancing 1.2 cm lesion within the right hepatic lobe consistent with a known hepatic hemangioma (2:17). Status post cholecystectomy. No biliary dilatation. Pancreas: No focal lesion. Normal pancreatic contour. No surrounding inflammatory changes. No main pancreatic ductal dilatation. Spleen: Normal in size without focal abnormality. Adrenals/Urinary Tract: No adrenal nodule bilaterally. Bilateral kidneys enhance symmetrically. No hydronephrosis. No hydroureter. The urinary bladder is unremarkable. On delayed imaging, there is no urothelial wall thickening and there are no filling defects in the opacified portions of the bilateral collecting systems or ureters. Stomach/Bowel: Stomach is within normal limits. No evidence of bowel wall thickening or dilatation. Stool throughout the ascending, transverse, descending colon. Appendix appears normal. Vascular/Lymphatic: No abdominal aorta or iliac aneurysm. Mild atherosclerotic plaque of the aorta and its  branches. No abdominal, pelvic, or inguinal lymphadenopathy. Reproductive: Status post hysterectomy. No adnexal masses. Other: No intraperitoneal free fluid. No intraperitoneal free gas. No organized fluid collection. Musculoskeletal: No abdominal wall hernia or abnormality. Surgical clips noted along the anterior peritoneum. No suspicious lytic or blastic osseous lesions. No acute displaced fracture. IMPRESSION: 1. Interval development of cluster of pulmonary nodules within the right lower lobe measuring up to 6 x 8 mm. Findings suggestive of infection/inflammation Non-contrast chest CT at 3-6 months is recommended. If the nodules are stable at time of repeat CT, then future CT at 18-24 months (from today's scan) is considered optional for low-risk patients, but is recommended for high-risk patients. This recommendation follows the consensus statement: Guidelines for Management of Incidental Pulmonary Nodules Detected on CT Images: From the Fleischner Society 2017; Radiology 2017; 284:228-243. 2. Stool throughout the ascending, transverse, descending colon-correlate for constipation. 3. Otherwise No acute intra-abdominal or intrapelvic abnormality. 4.  Aortic Atherosclerosis (ICD10-I70.0). Electronically Signed   By: Tish Frederickson M.D.   On: 08/31/2022 21:35   DG Chest 2 View  Result Date: 08/31/2022 CLINICAL DATA:  Chest pain EXAM: CHEST - 2 VIEW COMPARISON:  01/08/2006 FINDINGS: The heart size and mediastinal contours are within normal limits. Both lungs are clear. The visualized skeletal structures are unremarkable. IMPRESSION: No active cardiopulmonary disease. Electronically Signed   By: Charlett Nose M.D.   On: 08/31/2022 19:42    Procedures Procedures    Medications Ordered in ED Medications  pantoprazole (PROTONIX) injection 40 mg (40 mg Intravenous Given 08/31/22 2053)  sodium chloride 0.9 % bolus 500 mL (500 mLs Intravenous New Bag/Given 08/31/22 2053)  iohexol (OMNIPAQUE) 300 MG/ML solution  100 mL (100 mLs Intravenous Contrast Given 08/31/22 2108)    ED Course/ Medical Decision Making/ A&P                             Medical Decision Making Amount and/or Complexity of Data Reviewed Labs: ordered. Radiology: ordered.  Risk Prescription drug management.   Patient is a 77 year old female who presents with discomfort in her epigastric area and that goes to her back.  Initially her symptoms improved with some calcium carbonate.  It would come back intermittently and she felt a little worse today.  She does not have exertional symptoms.  No associated shortness of breath.  No other symptoms that would be more suggestive of ACS.  Her EKG does not show any ischemic changes.  She  had 2 negative troponins.  She does not have other symptoms that would be more suggestive of PE.  No persistent chest pain or upper back pain that would be more concerning for aortic dissection.  She had a CT scan of her abdomen pelvis which showed no evidence of retained stone in the bile ducts.  No evidence of pancreatitis.  No evidence of aortic injury in the abdomen.  No other acute abnormality.  Chest x-ray two-view was interpreted by me and confirmed by the radiologist to show no acute abnormality.  She did have some pulmonary nodules on her CT.  I discussed these findings with her and that she will need a repeat CT scan in 3 months.  This can be arranged by her primary care doctor.  She was given a dose of IV Protonix.  She says her symptoms have improved.  Currently she is asymptomatic.  I suspect her symptoms are related to gastritis.  She was discharged home in good condition.  She was given a prescription for Prilosec.  She was encouraged to have close follow-up with her PCP.  Instructions were given on a bland diet.  Return precautions were given.  Final Clinical Impression(s) / ED Diagnoses Final diagnoses:  Pulmonary nodule  Acute gastritis without hemorrhage, unspecified gastritis type    Rx / DC  Orders ED Discharge Orders          Ordered    omeprazole (PRILOSEC) 20 MG capsule  Daily        08/31/22 2228              Rolan Bucco, MD 08/31/22 2233

## 2022-08-31 NOTE — ED Notes (Signed)
Save blue and dark green in main lab 

## 2022-09-05 DIAGNOSIS — R911 Solitary pulmonary nodule: Secondary | ICD-10-CM | POA: Diagnosis not present

## 2022-09-05 DIAGNOSIS — K5900 Constipation, unspecified: Secondary | ICD-10-CM | POA: Diagnosis not present

## 2022-09-05 DIAGNOSIS — I10 Essential (primary) hypertension: Secondary | ICD-10-CM | POA: Diagnosis not present

## 2022-09-05 DIAGNOSIS — K219 Gastro-esophageal reflux disease without esophagitis: Secondary | ICD-10-CM | POA: Diagnosis not present

## 2022-09-05 DIAGNOSIS — I700 Atherosclerosis of aorta: Secondary | ICD-10-CM | POA: Diagnosis not present

## 2022-10-29 DIAGNOSIS — Z630 Problems in relationship with spouse or partner: Secondary | ICD-10-CM | POA: Diagnosis not present

## 2022-12-03 DIAGNOSIS — L814 Other melanin hyperpigmentation: Secondary | ICD-10-CM | POA: Diagnosis not present

## 2022-12-03 DIAGNOSIS — D225 Melanocytic nevi of trunk: Secondary | ICD-10-CM | POA: Diagnosis not present

## 2022-12-03 DIAGNOSIS — L821 Other seborrheic keratosis: Secondary | ICD-10-CM | POA: Diagnosis not present

## 2022-12-03 DIAGNOSIS — L570 Actinic keratosis: Secondary | ICD-10-CM | POA: Diagnosis not present

## 2022-12-03 DIAGNOSIS — L578 Other skin changes due to chronic exposure to nonionizing radiation: Secondary | ICD-10-CM | POA: Diagnosis not present

## 2022-12-03 DIAGNOSIS — D692 Other nonthrombocytopenic purpura: Secondary | ICD-10-CM | POA: Diagnosis not present

## 2022-12-13 DIAGNOSIS — R030 Elevated blood-pressure reading, without diagnosis of hypertension: Secondary | ICD-10-CM | POA: Diagnosis not present

## 2023-01-02 ENCOUNTER — Other Ambulatory Visit: Payer: Self-pay | Admitting: Family Medicine

## 2023-01-02 DIAGNOSIS — R911 Solitary pulmonary nodule: Secondary | ICD-10-CM

## 2023-01-14 ENCOUNTER — Inpatient Hospital Stay
Admission: RE | Admit: 2023-01-14 | Discharge: 2023-01-14 | Disposition: A | Discharge status: Home / Self Care | Payer: PPO | Source: Ambulatory Visit | Attending: Family Medicine | Admitting: Family Medicine

## 2023-01-14 DIAGNOSIS — R918 Other nonspecific abnormal finding of lung field: Secondary | ICD-10-CM | POA: Diagnosis not present

## 2023-01-14 DIAGNOSIS — R911 Solitary pulmonary nodule: Secondary | ICD-10-CM

## 2023-01-23 ENCOUNTER — Institutional Professional Consult (permissible substitution): Payer: PPO | Admitting: Pulmonary Disease

## 2023-01-28 DIAGNOSIS — Z09 Encounter for follow-up examination after completed treatment for conditions other than malignant neoplasm: Secondary | ICD-10-CM | POA: Diagnosis not present

## 2023-01-28 DIAGNOSIS — L578 Other skin changes due to chronic exposure to nonionizing radiation: Secondary | ICD-10-CM | POA: Diagnosis not present

## 2023-02-21 ENCOUNTER — Institutional Professional Consult (permissible substitution): Payer: PPO | Admitting: Pulmonary Disease

## 2023-02-25 DIAGNOSIS — M8588 Other specified disorders of bone density and structure, other site: Secondary | ICD-10-CM | POA: Diagnosis not present

## 2023-02-25 DIAGNOSIS — E559 Vitamin D deficiency, unspecified: Secondary | ICD-10-CM | POA: Diagnosis not present

## 2023-02-25 DIAGNOSIS — Z0000 Encounter for general adult medical examination without abnormal findings: Secondary | ICD-10-CM | POA: Diagnosis not present

## 2023-02-25 DIAGNOSIS — Z860101 Personal history of adenomatous and serrated colon polyps: Secondary | ICD-10-CM | POA: Diagnosis not present

## 2023-02-25 DIAGNOSIS — N8111 Cystocele, midline: Secondary | ICD-10-CM | POA: Diagnosis not present

## 2023-02-25 DIAGNOSIS — I700 Atherosclerosis of aorta: Secondary | ICD-10-CM | POA: Diagnosis not present

## 2023-02-25 DIAGNOSIS — H6122 Impacted cerumen, left ear: Secondary | ICD-10-CM | POA: Diagnosis not present

## 2023-02-25 DIAGNOSIS — F4321 Adjustment disorder with depressed mood: Secondary | ICD-10-CM | POA: Diagnosis not present

## 2023-02-25 DIAGNOSIS — R350 Frequency of micturition: Secondary | ICD-10-CM | POA: Diagnosis not present

## 2023-02-25 DIAGNOSIS — Z8543 Personal history of malignant neoplasm of ovary: Secondary | ICD-10-CM | POA: Diagnosis not present

## 2023-02-25 DIAGNOSIS — I10 Essential (primary) hypertension: Secondary | ICD-10-CM | POA: Diagnosis not present

## 2023-03-10 ENCOUNTER — Other Ambulatory Visit: Payer: Self-pay | Admitting: Family Medicine

## 2023-03-10 DIAGNOSIS — Z1231 Encounter for screening mammogram for malignant neoplasm of breast: Secondary | ICD-10-CM

## 2023-03-17 ENCOUNTER — Ambulatory Visit: Payer: PPO | Admitting: Pulmonary Disease

## 2023-03-17 ENCOUNTER — Encounter: Payer: Self-pay | Admitting: Pulmonary Disease

## 2023-03-17 VITALS — BP 128/78 | HR 73 | Temp 97.8°F | Ht 66.5 in | Wt 138.8 lb

## 2023-03-17 DIAGNOSIS — R911 Solitary pulmonary nodule: Secondary | ICD-10-CM

## 2023-03-17 NOTE — Patient Instructions (Addendum)
Thank you for visiting Dr. Tonia Brooms at [facility]. Today we recommend the following:  Return if symptoms worsen or fail to improve, for follow up with PCP, Dr. Cliffton Asters .    Please do your part to reduce the spread of COVID-19.

## 2023-03-17 NOTE — Progress Notes (Signed)
Synopsis: Referred in Dec 25, 2020for for pulmonary nodule by Laurann Montana, MD  Subjective:   PATIENT ID: Zoe Jackson GENDER: female DOB: Feb 16, 1946, MRN: [id number]  Chief Complaint  Patient presents with   Consult    Consult for lung nodules 01/14/2023.  No sx noted.    This is a 77 year old female, past medical history of ovarian cancer, mitral valve prolapse.  Cousin with pancreatic cancer.  No history in the family of breast cancer.  Mother with a history of lung cancer.  Patient is a lifelong never smoker.Patient had CT imaging of the chest in October 2024 which revealed cluster areas of nodularity in the subpleural right lateral lower lobe.  Most measuring around 4 mm in size.  Patient has no complaints today.  She is just anxious because she recently lost her husband.  Patient denies respiratory symptoms.    Past Medical History:  Diagnosis Date   MVP (mitral valve prolapse)    Ovarian cancer (HCC)    SBO (small bowel obstruction) (HCC)      Family History  Problem Relation Age of Onset   Dementia Mother    Hypertension Mother    Lung cancer Mother 42   Parkinson's disease Father    Hypertension Father    Dementia Maternal Grandmother    Heart attack Maternal Grandfather    Dementia Paternal Grandfather    Pancreatic cancer Cousin        pat first cousin   CAD Brother 47       Died of MI 04/02/23   Breast cancer Neg Hx      Past Surgical History:  Procedure Laterality Date   ABDOMINAL EXPLORATION SURGERY  01-Apr-2006   LOA   ABDOMINAL HYSTERECTOMY  40 yrs ago   TAH   CHOLECYSTECTOMY     ERCP N/A 03/14/2013   Procedure: ENDOSCOPIC RETROGRADE CHOLANGIOPANCREATOGRAPHY (ERCP);  Surgeon: Barrie Folk, MD;  Location: [facility] ENDOSCOPY;  Service: Endoscopy;  Laterality: N/A;    Social History   Socioeconomic History   Marital status: Widowed    Spouse name: Not on file   Number of children: 0   Years of education: Not on file   Highest education level: Not on  file  Occupational History   Not on file  Tobacco Use   Smoking status: Never   Smokeless tobacco: Never  Substance and Sexual Activity   Alcohol use: No   Drug use: No   Sexual activity: Not on file  Other Topics Concern   Not on file  Social History Narrative   Lives with husband.     Social Determinants of Health   Financial Resource Strain: Not on file  Food Insecurity: Not on file  Transportation Needs: Not on file  Physical Activity: Not on file  Stress: Not on file  Social Connections: Not on file  Intimate Partner Violence: Not on file     Allergies  Allergen Reactions   Azithromycin Other (See Comments)    "turned tongue black"   Lisinopril Cough   Other     No Blood Transfusion please   Sulfa Antibiotics     Made her weak     Outpatient Medications Prior to Visit  Medication Sig Dispense Refill   amLODipine (NORVASC) 2.5 MG tablet Take 2.5 mg by mouth daily.     B Complex-C-E-Zn (B COMPLEX-C-E-ZINC) tablet Take 1 tablet by mouth daily.     BIOTIN PO Take by mouth every other day.  Cholecalciferol (VITAMIN D) 2000 units CAPS Take by mouth as directed.     Coenzyme Q10 (CO Q 10 PO) Take 1 tablet by mouth daily.     Coenzyme Q10 (CO Q-10) 100 MG CAPS Take 100 mg by mouth See admin instructions.     Magnesium 300 MG CAPS Take by mouth as directed.     Menaquinone-7 (VITAMIN K2) 100 MCG CAPS Take 100 mcg by mouth See admin instructions.     Misc Natural Products (LUTEIN 20 PO) Take by mouth.     omeprazole (PRILOSEC) 20 MG capsule Take 1 capsule (20 mg total) by mouth daily. 30 capsule 0   OVER THE COUNTER MEDICATION Take 1 each by mouth daily.     OVER THE COUNTER MEDICATION Take 4 capsules by mouth daily in the afternoon. Hibiscus Flower     OVER THE COUNTER MEDICATION Take 1 capsule by mouth daily in the afternoon. Olive Leaf & Oregano Immune Wellness     OVER THE COUNTER MEDICATION Take 1 capsule by mouth daily in the afternoon. [employer]      OVER THE COUNTER MEDICATION Take 100 mg by mouth daily in the afternoon. Ubiquinol     QUERCETIN PO Take 1 tablet by mouth daily.     No facility-administered medications prior to visit.    Review of Systems  Constitutional:  Negative for chills, fever, malaise/fatigue and weight loss.  HENT:  Negative for hearing loss, sore throat and tinnitus.   Eyes:  Negative for blurred vision and double vision.  Respiratory:  Negative for cough, hemoptysis, sputum production, shortness of breath, wheezing and stridor.   Cardiovascular:  Negative for chest pain, palpitations, orthopnea, leg swelling and PND.  Gastrointestinal:  Negative for abdominal pain, constipation, diarrhea, heartburn, nausea and vomiting.  Genitourinary:  Negative for dysuria, hematuria and urgency.  Musculoskeletal:  Negative for joint pain and myalgias.  Skin:  Negative for itching and rash.  Neurological:  Negative for dizziness, tingling, weakness and headaches.  Endo/Heme/Allergies:  Negative for environmental allergies. Does not bruise/bleed easily.  Psychiatric/Behavioral:  Negative for depression. The patient is not nervous/anxious and does not have insomnia.   All other systems reviewed and are negative.    Objective:  Physical Exam Vitals reviewed.  Constitutional:      General: She is not in acute distress.    Appearance: She is well-developed.  HENT:     Head: Normocephalic and atraumatic.  Eyes:     General: No scleral icterus.    Conjunctiva/sclera: Conjunctivae normal.     Pupils: Pupils are equal, round, and reactive to light.  Neck:     Vascular: No JVD.     Trachea: No tracheal deviation.  Cardiovascular:     Rate and Rhythm: Normal rate and regular rhythm.     Heart sounds: Normal heart sounds. No murmur heard. Pulmonary:     Effort: Pulmonary effort is normal. No tachypnea, accessory muscle usage or respiratory distress.     Breath sounds: No stridor. No wheezing, rhonchi or rales.   Abdominal:     General: There is no distension.     Palpations: Abdomen is soft.     Tenderness: There is no abdominal tenderness.  Musculoskeletal:        General: No tenderness.     Cervical back: Neck supple.  Lymphadenopathy:     Cervical: No cervical adenopathy.  Skin:    General: Skin is warm and dry.     Capillary Refill: Capillary  refill takes less than 2 seconds.     Findings: No rash.  Neurological:     Mental Status: She is alert and oriented to person, place, and time.  Psychiatric:        Behavior: Behavior normal.      Vitals:   03/17/23 0900  BP: 128/78  Pulse: 73  Temp: 97.8 F (36.6 C)  TempSrc: Oral  SpO2: 99%  Weight: 138 lb 12.8 oz (63 kg)  Height: 5' 6.5" (1.689 m)   99% on RA BMI Readings from Last 3 Encounters:  03/17/23 22.07 kg/m  08/13/21 23.24 kg/m  07/05/21 22.24 kg/m   Wt Readings from Last 3 Encounters:  03/17/23 138 lb 12.8 oz (63 kg)  08/13/21 144 lb (65.3 kg)  07/05/21 142 lb (64.4 kg)     CBC    Component Value Date/Time   WBC 4.2 08/31/2022 1922   RBC 4.67 08/31/2022 1922   HGB 14.3 08/31/2022 1922   HCT 42.6 08/31/2022 1922   PLT 210 08/31/2022 1922   MCV 91.2 08/31/2022 1922   MCH 30.6 08/31/2022 1922   MCHC 33.6 08/31/2022 1922   RDW 12.9 08/31/2022 1922   LYMPHSABS 0.7 03/12/2013 0600   MONOABS 0.4 03/12/2013 0600   EOSABS 0.0 03/12/2013 0600   BASOSABS 0.0 03/12/2013 0600     Chest Imaging: 01/14/2023 CT chest: Small 4 mm clustered nodularity in the right lower lobe. The patient's images have been independently reviewed by me.    Pulmonary Functions Testing Results:     No data to display          FeNO:   Pathology:   Echocardiogram:   Heart Catheterization:     Assessment & Plan:     ICD-10-CM   1. Nodule of lower lobe of right lung  R91.1       Discussion:  This is a 77 year old female lifelong non-smoker found to have incidental area of clustered nodules in the right lower  lobe.  Plan: No specific follow-up needed for small lung nodules. I gave her reassurance today that I do not think she needs any additional follow-up.  If she develops respiratory symptoms we could be dealing with underlying NTM as it is common in females between 11 and 84 years old.  Could represent these findings on imaging.  Would recommend repeat CT scan in 2 years for follow-up.  She is going to keep that in mind and continue to follow-up with her PCP in the future.    Current Outpatient Medications:    amLODipine (NORVASC) 2.5 MG tablet, Take 2.5 mg by mouth daily., Disp: , Rfl:    B Complex-C-E-Zn (B COMPLEX-C-E-ZINC) tablet, Take 1 tablet by mouth daily., Disp: , Rfl:    BIOTIN PO, Take by mouth every other day., Disp: , Rfl:    Cholecalciferol (VITAMIN D) 2000 units CAPS, Take by mouth as directed., Disp: , Rfl:    Coenzyme Q10 (CO Q 10 PO), Take 1 tablet by mouth daily., Disp: , Rfl:    Coenzyme Q10 (CO Q-10) 100 MG CAPS, Take 100 mg by mouth See admin instructions., Disp: , Rfl:    Magnesium 300 MG CAPS, Take by mouth as directed., Disp: , Rfl:    Menaquinone-7 (VITAMIN K2) 100 MCG CAPS, Take 100 mcg by mouth See admin instructions., Disp: , Rfl:    Misc Natural Products (LUTEIN 20 PO), Take by mouth., Disp: , Rfl:    omeprazole (PRILOSEC) 20 MG capsule, Take 1 capsule (20  mg total) by mouth daily., Disp: 30 capsule, Rfl: 0   OVER THE COUNTER MEDICATION, Take 1 each by mouth daily., Disp: , Rfl:    OVER THE COUNTER MEDICATION, Take 4 capsules by mouth daily in the afternoon. Hibiscus Flower, Disp: , Rfl:    OVER THE COUNTER MEDICATION, Take 1 capsule by mouth daily in the afternoon. Olive Leaf & Oregan[employer] Immune Wellness, Disp: , Rfl:    OVER THE COUNTER MEDICATION, Take 1 capsule by mouth daily in the afternoon. [employer], Disp: , Rfl:    OVER THE COUNTER MEDICATION, Take 100 mg by mouth daily in the afternoon. Ubiquinol, Disp: , Rfl:    QUERCETIN PO, Take 1 tablet by  mouth daily., Disp: , Rfl:    Josephine Igo, DO [facility] 03/17/2023 9:13 AM

## 2023-03-25 ENCOUNTER — Ambulatory Visit
Admission: RE | Admit: 2023-03-25 | Discharge: 2023-03-25 | Disposition: A | Discharge status: Home / Self Care | Payer: PPO | Source: Ambulatory Visit | Attending: Family Medicine | Admitting: Family Medicine

## 2023-03-25 DIAGNOSIS — Z1231 Encounter for screening mammogram for malignant neoplasm of breast: Secondary | ICD-10-CM | POA: Diagnosis not present

## 2023-03-25 DIAGNOSIS — D485 Neoplasm of uncertain behavior of skin: Secondary | ICD-10-CM | POA: Diagnosis not present

## 2023-03-25 DIAGNOSIS — D0439 Carcinoma in situ of skin of other parts of face: Secondary | ICD-10-CM | POA: Diagnosis not present

## 2023-04-19 ENCOUNTER — Other Ambulatory Visit: Payer: Self-pay | Admitting: Medical Genetics

## 2023-04-25 ENCOUNTER — Other Ambulatory Visit (HOSPITAL_COMMUNITY)
Admission: RE | Admit: 2023-04-25 | Discharge: 2023-04-25 | Disposition: A | Discharge status: Home / Self Care | Payer: Self-pay | Source: Ambulatory Visit | Attending: Medical Genetics | Admitting: Medical Genetics

## 2023-05-08 LAB — GENECONNECT MOLECULAR SCREEN: Genetic Analysis Overall Interpretation: NEGATIVE

## 2023-06-16 DIAGNOSIS — H60312 Diffuse otitis externa, left ear: Secondary | ICD-10-CM | POA: Diagnosis not present

## 2023-06-16 DIAGNOSIS — H01001 Unspecified blepharitis right upper eyelid: Secondary | ICD-10-CM | POA: Diagnosis not present

## 2023-06-16 DIAGNOSIS — H6123 Impacted cerumen, bilateral: Secondary | ICD-10-CM | POA: Diagnosis not present

## 2023-07-04 DIAGNOSIS — H5213 Myopia, bilateral: Secondary | ICD-10-CM | POA: Diagnosis not present

## 2023-07-04 DIAGNOSIS — H2513 Age-related nuclear cataract, bilateral: Secondary | ICD-10-CM | POA: Diagnosis not present

## 2023-07-14 ENCOUNTER — Ambulatory Visit: Payer: PPO | Admitting: Obstetrics and Gynecology

## 2023-07-18 ENCOUNTER — Ambulatory Visit: Payer: PPO | Admitting: Obstetrics and Gynecology

## 2023-07-21 ENCOUNTER — Ambulatory Visit: Admitting: Obstetrics and Gynecology

## 2023-07-21 ENCOUNTER — Encounter: Payer: Self-pay | Admitting: Obstetrics and Gynecology

## 2023-07-21 VITALS — BP 140/77 | HR 76 | Ht 65.25 in | Wt 139.0 lb

## 2023-07-21 DIAGNOSIS — N3281 Overactive bladder: Secondary | ICD-10-CM | POA: Insufficient documentation

## 2023-07-21 DIAGNOSIS — R350 Frequency of micturition: Secondary | ICD-10-CM

## 2023-07-21 LAB — POCT URINALYSIS DIPSTICK
Bilirubin, UA: NEGATIVE
Blood, UA: NEGATIVE
Glucose, UA: NEGATIVE
Ketones, UA: NEGATIVE
Leukocytes, UA: NEGATIVE
Nitrite, UA: NEGATIVE
Protein, UA: NEGATIVE
Spec Grav, UA: 1.01 (ref 1.010–1.025)
Urobilinogen, UA: 0.2 U/dL
pH, UA: 6 (ref 5.0–8.0)

## 2023-07-21 NOTE — Patient Instructions (Signed)

## 2023-07-21 NOTE — Assessment & Plan Note (Deleted)
 We discussed the symptoms of overactive bladder (OAB), which include urinary urgency, urinary frequency, nocturia, with or without urge incontinence.  While we do not know the exact etiology of OAB, several treatment options exist. We discussed management including behavioral therapy (decreasing bladder irritants, urge suppression strategies, timed voids, bladder retraining), physical therapy, medication; for refractory cases posterior tibial nerve stimulation, sacral neuromodulation, and intravesical botulinum toxin injection.  - She is interested in pelvic PT, referral placed

## 2023-07-21 NOTE — Progress Notes (Signed)
 New Patient Evaluation and Consultation  Referring Provider: Victorio Grave, MD PCP: Victorio Grave, MD Date of Service: 07/21/2023  SUBJECTIVE Chief Complaint: New Patient (Initial Visit) Zoe Jackson is a 78 y.o. female here for a consult for urinary urgency and frequency. )  History of Present Illness: Zoe Jackson is a 78 y.o. White or Caucasian female seen in consultation at the request of Dr Camilo Cella for evaluation of incontinence.     Urinary Symptoms: Leaks urine with with a full bladder and while asleep Has been present for about several years.  Leaks 0-2 time(s) per day. Some days no issues.  Pad use: 1 liners/ mini-pads per day.   Patient is not bothered by UI symptoms.  Day time voids 5-6.  Nocturia: 4 times per night to void. Voiding dysfunction:  empties bladder well.  Patient does not use a catheter to empty bladder.  When urinating, patient feels a weak stream and dribbling after finishing Drinks: herbal tea, water per day  UTIs:  0  UTI's in the last year.   Denies history of blood in urine and kidney or bladder stones Pt reports when she was younger, she had "bladder stretching" for UTI symptoms.    Pelvic Organ Prolapse Symptoms:                  Patient Denies a feeling of a bulge the vaginal area.  Pt reports that her physician saw a cystocele  Bowel Symptom: Bowel movements: 3-5 time(s) per day Stool consistency: soft  Straining: no.  Splinting: no.  Incomplete evacuation: no.  Patient Denies accidental bowel leakage / fecal incontinence Bowel regimen: none  HM Colonoscopy          Completed or No Longer Recommended     Colonoscopy  Discontinued      Frequency changed to Never automatically (Topic No Longer Applies)   11/16/2019  Outside Claim: PR COLSC FLX W/RMVL OF TUMOR POLYP LESION SNARE TQ   Only the first 1 history entries have been loaded, but more history exists.                Sexual Function Sexually active: no.   Sexual orientation: Straight Pain with sex: No  Pelvic Pain Denies pelvic pain  Past Medical History:  Past Medical History:  Diagnosis Date   MVP (mitral valve prolapse)    Ovarian cancer (HCC)    Also had radiation therapy   SBO (small bowel obstruction) (HCC)      Past Surgical History:   Past Surgical History:  Procedure Laterality Date   ABDOMINAL EXPLORATION SURGERY  2007   LOA   ABDOMINAL HYSTERECTOMY  40 yrs ago   TAH   CHOLECYSTECTOMY     ERCP N/A 03/14/2013   Procedure: ENDOSCOPIC RETROGRADE CHOLANGIOPANCREATOGRAPHY (ERCP);  Surgeon: Barbie Boon, MD;  Location: [facility] ENDOSCOPY;  Service: Endoscopy;  Laterality: N/A;     Past OB/GYN History: OB History  Gravida Para Term Preterm AB Living  0 0 0 0 0 0  SAB IAB Ectopic Multiple Live Births  0 0 0 0 0    S/p hysterectomy  Medications: Patient has a current medication list which includes the following prescription(s): b complex-c-e-zinc, biotin, vitamin d, coenzyme q10, co q-10, magnesium, vitamin k2, misc natural products, OVER THE COUNTER MEDICATION, OVER THE COUNTER MEDICATION, OVER THE COUNTER MEDICATION, OVER THE COUNTER MEDICATION, OVER THE COUNTER MEDICATION, and quercetin.   Allergies: Patient is allergic to azithromycin, lisinopril, other, and sulfa antibiotics.  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 Regular exercise: Yes: walking 2-3 miles per day, [employer] weights at [employer] History of abuse: No  Family History:   Family History  Problem Relation Age of Onset   Dementia Mother    Hypertension Mother    Lung cancer Mother 28   Parkinson's disease Father    Hypertension Father    Dementia Maternal Grandmother    Heart  attack Maternal Grandfather    Dementia Paternal Grandfather    Pancreatic cancer Cousin        pat first cousin   CAD Brother 29       Died of MI 04/07/2024   Breast cancer Neg Hx      Review of Systems: Review of Systems  Constitutional:  Negative for fever, malaise/fatigue and weight loss.  Respiratory:  Negative for cough, shortness of breath and wheezing.   Cardiovascular:  Negative for chest pain, palpitations and leg swelling.  Gastrointestinal:  Negative for abdominal pain and blood in stool.  Genitourinary:  Negative for dysuria.  Musculoskeletal:  Negative for myalgias.  Skin:  Negative for rash.  Neurological:  Negative for dizziness and headaches.  Endo/Heme/Allergies:  Does not bruise/bleed easily.  Psychiatric/Behavioral:  Negative for depression. The patient is not nervous/anxious.      OBJECTIVE Physical Exam: Vitals:   07/21/23 0801  BP: (!) 140/77  Pulse: 76  Weight: 139 lb (63 kg)  Height: 5' 5.25" (1.657 m)    Physical Exam Vitals reviewed. Exam conducted with a chaperone present.  Constitutional:      General: She is not in acute distress. Pulmonary:     Effort: Pulmonary effort is normal.  Abdominal:     General: There is no distension.     Palpations: Abdomen is soft.     Tenderness: There is no abdominal tenderness. There is no rebound.  Musculoskeletal:        General: No swelling. Normal range of motion.  Skin:    General: Skin is warm and dry.     Findings: No rash.  Neurological:     Mental Status: She is alert and oriented to person, place, and time.  Psychiatric:        Mood and Affect: Mood normal.        Behavior: Behavior normal.      GU / Detailed Urogynecologic Evaluation:  Pelvic Exam: Normal external female genitalia; Bartholin's and Skene's glands normal in appearance; urethral meatus normal in appearance, no urethral masses or discharge.   CST: negative  s/p hysterectomy: Speculum exam reveals normal vaginal mucosa  with  atrophy and normal vaginal cuff.  Adnexa no mass, fullness, tenderness.    Pelvic floor strength I/V  Pelvic floor musculature: Right levator non-tender, Right obturator non-tender, Left levator non-tender, Left obturator non-tender  POP-Q:   POP-Q  -3                                            Aa   -3  Ba  -5.5                                              C   2                                            Gh  3.5                                            Pb  5.5                                            tvl   -2                                            Ap  -2                                            Bp                                                 D      Rectal Exam:  Normal external rectum  Post-Void Residual (PVR) by Bladder Scan: In order to evaluate bladder emptying, we discussed obtaining a postvoid residual and patient agreed to this procedure.  Procedure: The ultrasound unit was placed on the patient's abdomen in the suprapubic region after the patient had voided.    Post Void Residual - 07/21/23 0829       Post Void Residual   Post Void Residual 2 mL              Laboratory Results: Lab Results  Component Value Date   COLORU Yellow 07/21/2023   CLARITYU Clear 07/21/2023   GLUCOSEUR Negative 07/21/2023   BILIRUBINUR Negative 07/21/2023   KETONESU Negative 07/21/2023   SPECGRAV 1.010 07/21/2023   RBCUR Negative 07/21/2023   PHUR 6.0 07/21/2023   PROTEINUR Negative 07/21/2023   UROBILINOGEN 0.2 07/21/2023   LEUKOCYTESUR Negative 07/21/2023    Lab Results  Component Value Date   CREATININE 0.68 08/31/2022   CREATININE 0.46 (L) 03/16/2013   CREATININE 0.51 03/15/2013    No results found for: "HGBA1C"  Lab Results  Component Value Date   HGB 14.3 08/31/2022     ASSESSMENT AND PLAN Ms. Nappier is a 78 y.o. with:  1. Overactive bladder   2. Urinary frequency     Overactive  bladder Assessment & Plan: We discussed the symptoms of overactive bladder (OAB), which include urinary urgency, urinary frequency, nocturia, with or without urge incontinence.  While we do not know the exact etiology of OAB, several treatment options exist.  We discussed management including behavioral therapy (decreasing bladder irritants, urge suppression strategies, timed voids, bladder retraining), physical therapy, medication; for refractory cases posterior tibial nerve stimulation, sacral neuromodulation, and intravesical botulinum toxin injection.  - She is interested in pelvic PT, referral placed  Orders: -     AMB referral to rehabilitation  Urinary frequency -     POCT urinalysis dipstick  Return as needed   Arma Lamp, MD

## 2023-07-21 NOTE — Assessment & Plan Note (Signed)
 We discussed the symptoms of overactive bladder (OAB), which include urinary urgency, urinary frequency, nocturia, with or without urge incontinence.  While we do not know the exact etiology of OAB, several treatment options exist. We discussed management including behavioral therapy (decreasing bladder irritants, urge suppression strategies, timed voids, bladder retraining), physical therapy, medication; for refractory cases posterior tibial nerve stimulation, sacral neuromodulation, and intravesical botulinum toxin injection.  - She is interested in pelvic PT, referral placed

## 2023-07-29 DIAGNOSIS — Z09 Encounter for follow-up examination after completed treatment for conditions other than malignant neoplasm: Secondary | ICD-10-CM | POA: Diagnosis not present

## 2023-07-29 DIAGNOSIS — L820 Inflamed seborrheic keratosis: Secondary | ICD-10-CM | POA: Diagnosis not present

## 2023-07-29 DIAGNOSIS — L538 Other specified erythematous conditions: Secondary | ICD-10-CM | POA: Diagnosis not present

## 2023-07-29 DIAGNOSIS — Z86007 Personal history of in-situ neoplasm of skin: Secondary | ICD-10-CM | POA: Diagnosis not present

## 2023-07-29 DIAGNOSIS — Z08 Encounter for follow-up examination after completed treatment for malignant neoplasm: Secondary | ICD-10-CM | POA: Diagnosis not present

## 2023-08-31 DIAGNOSIS — R001 Bradycardia, unspecified: Secondary | ICD-10-CM | POA: Diagnosis not present

## 2023-09-16 DIAGNOSIS — T63441A Toxic effect of venom of bees, accidental (unintentional), initial encounter: Secondary | ICD-10-CM | POA: Diagnosis not present

## 2023-09-23 ENCOUNTER — Emergency Department (HOSPITAL_COMMUNITY)
Admission: EM | Admit: 2023-09-23 | Discharge: 2023-09-23 | Disposition: A | Discharge status: Home / Self Care | Attending: Emergency Medicine | Admitting: Emergency Medicine

## 2023-09-23 ENCOUNTER — Other Ambulatory Visit: Payer: Self-pay

## 2023-09-23 ENCOUNTER — Ambulatory Visit: Attending: Obstetrics and Gynecology

## 2023-09-23 ENCOUNTER — Encounter (HOSPITAL_COMMUNITY): Payer: Self-pay | Admitting: Emergency Medicine

## 2023-09-23 DIAGNOSIS — R279 Unspecified lack of coordination: Secondary | ICD-10-CM | POA: Insufficient documentation

## 2023-09-23 DIAGNOSIS — M62838 Other muscle spasm: Secondary | ICD-10-CM | POA: Diagnosis not present

## 2023-09-23 DIAGNOSIS — M6281 Muscle weakness (generalized): Secondary | ICD-10-CM | POA: Diagnosis not present

## 2023-09-23 DIAGNOSIS — R293 Abnormal posture: Secondary | ICD-10-CM | POA: Insufficient documentation

## 2023-09-23 DIAGNOSIS — Z79899 Other long term (current) drug therapy: Secondary | ICD-10-CM | POA: Diagnosis not present

## 2023-09-23 DIAGNOSIS — I10 Essential (primary) hypertension: Secondary | ICD-10-CM | POA: Insufficient documentation

## 2023-09-23 DIAGNOSIS — Z8543 Personal history of malignant neoplasm of ovary: Secondary | ICD-10-CM | POA: Diagnosis not present

## 2023-09-23 LAB — I-STAT CHEM 8, ED
BUN: 17 mg/dL (ref 8–23)
Calcium, Ion: 1.13 mmol/L — ABNORMAL LOW (ref 1.15–1.40)
Chloride: 97 mmol/L — ABNORMAL LOW (ref 98–111)
Creatinine, Ser: 0.9 mg/dL (ref 0.44–1.00)
Glucose, Bld: 106 mg/dL — ABNORMAL HIGH (ref 70–99)
HCT: 38 % (ref 36.0–46.0)
Hemoglobin: 12.9 g/dL (ref 12.0–15.0)
Potassium: 3.9 mmol/L (ref 3.5–5.1)
Sodium: 136 mmol/L (ref 135–145)
TCO2: 27 mmol/L (ref 22–32)

## 2023-09-23 MED ORDER — AMLODIPINE BESYLATE 5 MG PO TABS: 5.0000 mg | ORAL_TABLET | Freq: Every day | ORAL | 1 refills | Status: AC

## 2023-09-23 MED ORDER — AMLODIPINE BESYLATE 5 MG PO TABS
5.0000 mg | ORAL_TABLET | Freq: Once | ORAL | Status: AC
  Administered 2023-09-23: 5 mg via ORAL
  Filled 2023-09-23: qty 1

## 2023-09-23 NOTE — Discharge Instructions (Signed)
 Start taking the Norvasc at 5 mg daily.  Follow-up with your primary care doctor to have your blood pressure rechecked.

## 2023-09-23 NOTE — ED Provider Notes (Signed)
 [place] EMERGENCY DEPARTMENT AT [facility] Provider Note   CSN: [id number] Arrival date & time: 09/23/23  4098     Patient presents with: Hypertension   Zoe Jackson is a 78 y.o. female.    Hypertension   Patient has history of small bowel obstruction ovarian cancer mitral valve prolapse.  Patient states she also has history of hypertension but has not been on medications for a while.  Patient states she has had some increased stressors with the death of her husband couple months ago.  Patient has been taking her blood pressure recently and has been seeing it fluctuate.  She is noticing blood pressures up to the 160s systolic with diastolics in the low 100s.  She is not having any chest pain or shortness of breath.  She has not had any vomiting or diarrhea.  Patient is not sure why her blood pressure is fluctuating.  She has not seen her primary care doctor.  She came to the ED this evening because it was still elevated and she was concerned that it would be too high in the middle of the night    Prior to Admission medications   Medication Sig Start Date End Date Taking? Authorizing Provider  amLODipine (NORVASC) 5 MG tablet Take 1 tablet (5 mg total) by mouth daily. 09/23/23  Yes Trish Furl, MD  B Complex-C-E-Zn (B COMPLEX-C-E-ZINC) tablet Take 1 tablet by mouth daily.    [provider]  BIOTIN PO Take by mouth every other day.    [provider]  Cholecalciferol (VITAMIN D ) 2000 units CAPS Take by mouth as directed.    [provider]  Coenzyme Q10 (CO Q 10 PO) Take 1 tablet by mouth daily.    [provider]  Coenzyme Q10 (CO Q-10) 100 MG CAPS Take 100 mg by mouth See admin instructions.    [provider]  Magnesium 300 MG CAPS Take by mouth as directed.    [provider]  Menaquinone-7 (VITAMIN K2) 100 MCG CAPS Take 100 mcg by mouth See admin instructions.    [provider]  Misc Natural Products  (LUTEIN 20 PO) Take by mouth.    [provider]  OVER THE COUNTER MEDICATION Take 1 each by mouth daily.    [provider]  OVER THE COUNTER MEDICATION Take 4 capsules by mouth daily in the afternoon. Hibiscus Flower    [provider]  OVER THE COUNTER MEDICATION Take 1 capsule by mouth daily in the afternoon. Olive Leaf & Oregano Immune Wellness    [provider]  OVER THE COUNTER MEDICATION Take 1 capsule by mouth daily in the afternoon. Sy[employer]    [provider]  OVER THE COUNTER MEDICATION Take 100 mg by mouth daily in the afternoon. Ubiquinol    [provider]  QUERCETIN PO Take 1 tablet by mouth daily.    [provider]    Allergies: Azithromycin, Lisinopril, Other, and Sulfa antibiotics    Review of Systems  Updated Vital Signs BP (!) 141/81   Pulse 72   Temp 98.4 F (36.9 C)   Resp 18   SpO2 100%   Physical Exam Vitals and nursing note reviewed.  Constitutional:      General: She is not in acute distress.    Appearance: She is well-developed.  HENT:     Head: Normocephalic and atraumatic.     Right Ear: External ear normal.     Left  Ear: External ear normal.   Eyes:     General: No scleral icterus.       Right eye: No discharge.        Left eye: No discharge.     Conjunctiva/sclera: Conjunctivae normal.   Neck:     Trachea: No tracheal deviation.   Cardiovascular:     Rate and Rhythm: Normal rate.  Pulmonary:     Effort: Pulmonary effort is normal. No respiratory distress.     Breath sounds: No stridor.  Abdominal:     General: There is no distension.   Musculoskeletal:        General: No swelling or deformity.     Cervical back: Neck supple.   Skin:    General: Skin is warm and dry.     Findings: No rash.   Neurological:     Mental Status: She is alert. Mental status is at baseline.     Cranial Nerves: No dysarthria or facial asymmetry.     Motor: No seizure  activity.     (all labs ordered are listed, but only abnormal results are displayed) Labs Reviewed  I-STAT CHEM 8, ED - Abnormal; Notable for the following components:      Result Value   Chloride 97 (*)    Glucose, Bld 106 (*)    Calcium, Ion 1.13 (*)    All other components within normal limits    EKG: None  Radiology: No results found.   Procedures   Medications Ordered in the ED  amLODipine (NORVASC) tablet 5 mg (5 mg Oral Given 09/23/23 2017)    Clinical Course as of 09/23/23 2057  Tue Sep 23, 2023  2042 I-STAT does not show any significant electrolyte abnormalities [JK]    Clinical Course User Index [JK] Trish Furl, MD                                 Medical Decision Making Risk Prescription drug management.   Patient presented to the ED for evaluation of hypertension.  Patient has noted this fluctuating blood pressures recently.  However she has had systolics up to the 170s and 180s.  She has had diastolics in the low 100s.  Patient's blood pressure was elevated here initially at 173/82.  Patient previously used to be on Norvasc but her prescription was over a-year-old.  Patient's not have any focal neurologic deficits.  She is not have any chest pain.  No signs of acute renal dysfunction on her laboratory test.  Patient was given an oral dose of Norvasc with an improvement of her blood pressure.  I will provide her prescription to continue to take as an outpatient.  Follow-up with her primary care doctor to be rechecked.     Final diagnoses:  Hypertension, unspecified type    ED Discharge Orders          Ordered    amLODipine (NORVASC) 5 MG tablet  Daily        09/23/23 2054               Trish Furl, MD 09/23/23 2057

## 2023-09-23 NOTE — Patient Instructions (Signed)
 Urge Incontinence  Ideal urination frequency is every 2-4 wakeful hours, which equates to 5-8 times within a 24-hour period.   Urge incontinence is leakage that occurs when the bladder muscle contracts, creating a sudden need to go before getting to the bathroom.   Going too often when your bladder isn't actually full can disrupt the body's automatic signals to store and hold urine longer, which will increase urgency/frequency.  In this case, the bladder "is running the show" and strategies can be learned to retrain this pattern.   One should be able to control the first urge to urinate, at around .  The bladder can hold up to a "grande latte," or . To help you gain control, practice the Urge Drill below when urgency strikes.  This drill will help retrain your bladder signals and allow you to store and hold urine longer.  The overall goal is to stretch out your time between voids to reach a more manageable voiding schedule.    Practice your "quick flicks" often throughout the day (each waking hour) even when you don't need feel the urge to go.  This will help strengthen your pelvic floor muscles, making them more effective in controlling leakage.  Urge Drill  When you feel an urge to go, follow these steps to regain control: Stop what you are doing and be still Take one deep breath, directing your air into your abdomen Think an affirming thought, such as "I've got this." Do 5 quick flicks of your pelvic floor Walk with control to the bathroom to void, or delay voiding  [facility] [address], [address] [place], [place] [address] Phone # [phone] Fax [phone]

## 2023-09-23 NOTE — Therapy (Signed)
 OUTPATIENT PHYSICAL THERAPY FEMALE PELVIC EVALUATION   Patient Name: Zoe Jackson MRN: [id number] DOB:03-05-1946, 78 y.o., female Today's Date: 09/23/2023  END OF SESSION:  PT End of Session - 09/23/23 1531     Visit Number 1    Date for PT Re-Evaluation 12/16/23    Authorization Type HTA    PT Start Time 1530    PT Stop Time 1610    PT Time Calculation (min) 40 min    Activity Tolerance Patient tolerated treatment well    Behavior During Therapy WFL for tasks assessed/performed          Past Medical History:  Diagnosis Date   MVP (mitral valve prolapse)    Ovarian cancer (HCC)    Also had radiation therapy   SBO (small bowel obstruction) (HCC)    Past Surgical History:  Procedure Laterality Date   ABDOMINAL EXPLORATION SURGERY  2007   LOA   ABDOMINAL HYSTERECTOMY  40 yrs ago   TAH   CHOLECYSTECTOMY     ERCP N/A 03/14/2013   Procedure: ENDOSCOPIC RETROGRADE CHOLANGIOPANCREATOGRAPHY (ERCP);  Surgeon: Barbie Boon, MD;  Location: [facility] ENDOSCOPY;  Service: Endoscopy;  Laterality: N/A;   Patient Active Problem List   Diagnosis Date Noted   Overactive bladder 07/21/2023   Essential hypertension 08/12/2021   Elevated coronary artery calcium score 08/12/2021   Genetic testing 02/22/2021   Ovarian cancer (HCC) 02/07/2021   Chest pain 09/23/2017   Abnormal EKG 09/23/2017   MVP (mitral valve prolapse) 05/25/2014   Pancreatitis 03/12/2013   Pancreatitis, acute 03/12/2013   Hypokalemia 03/12/2013   Hyponatremia 03/12/2013   Transaminitis 03/12/2013   Cholelithiasis 03/12/2013   GERD (gastroesophageal reflux disease) 03/12/2013    PCP: Victorio Grave, MD  REFERRING PROVIDER: Arma Lamp, MD   REFERRING DIAG: N32.81 (ICD-10-CM) - Overactive bladder  THERAPY DIAG:  Other muscle spasm  Unspecified lack of coordination  Muscle weakness (generalized)  Abnormal posture  Rationale for Evaluation and Treatment: Rehabilitation  ONSET DATE: Several  years  SUBJECTIVE:                                                                                                                                                                                           SUBJECTIVE STATEMENT: Pt started having issues with urinary urgency and incontinence several years ago.    PAIN:  Are you having pain? No   PRECAUTIONS: Other: history of radiation therapy for ovarian cancer   RED FLAGS: None   WEIGHT BEARING RESTRICTIONS: No  FALLS:  Has patient fallen in last 6 months? No  OCCUPATION: House cleaning business, gardens and volunteers at  a garden  ACTIVITY LEVEL : walks 3 miles a day  PLOF: Independent  PATIENT GOALS: decrease urinary urgency/leaking  PERTINENT HISTORY:  Ovarian cancer with radiation, small bowel obstruction, abdominal exploration surgery 2007, abdominal hysterectomy, cholecystectomy   BOWEL MOVEMENT: Pain with bowel movement: No Type of bowel movement:Frequency 3-4x/day and Strain no Fully empty rectum: No Leakage: No Pads: Yes: see below Fiber supplement/laxative No  URINATION: Pain with urination: No Fully empty bladder: Yes:   Stream: it will stop and she has to press to finish Urgency: Yes  Frequency: 5-6x/day, 3-4x/night Fluid Intake: herbal tea, water - drinks throughout the night Leakage: Urge to void and Walking to the bathroom Pads: Yes: 1 pad a day  INTERCOURSE:  Not sexually active, no history of pain  PREGNANCY: NA  PROLAPSE: None   OBJECTIVE:  Note: Objective measures were completed at Evaluation unless otherwise noted.   PATIENT SURVEYS:   PFIQ-7: 33  COGNITION: Overall cognitive status: Within functional limits for tasks assessed     SENSATION: Light touch: Appears intact   FUNCTIONAL TESTS:  Squat:all the way down, heels up, Lt weight shift Single leg stance:  Rt: WNL  Lt: WNL  Curl-up test: WNL Transversus abdominus test: pushing down and out   GAIT: Assistive  device utilized: None Comments: WNL  POSTURE: rounded shoulders, forward head, decreased lumbar lordosis, increased thoracic kyphosis, and posterior pelvic tilt   LUMBARAROM/PROM:  A/PROM A/PROM  Eval (% available)  Flexion 80  Extension 50  Right lateral flexion 75  Left lateral flexion 75  Right rotation 75  Left rotation 75   (Blank rows = not tested)  PALPATION:   General: WNL  Pelvic Alignment: WNL  Abdominal: significant scar tissue restriction, apical breathing pattern                External Perineal Exam: irritation and redness in perineum                              Internal Pelvic Floor: stenosis, no tenderness    Patient confirms identification and approves PT to assess internal pelvic floor and treatment Yes  PELVIC MMT:   MMT eval  Vaginal 2/5, 4 seconds, 6 repeat contractions, poor coordination, unable to bear down  Diastasis Recti WNL  (Blank rows = not tested)        TONE: low  PROLAPSE: Unable to bear down due to difficulty with coordination - could not assess vaginal wall laxity  TODAY'S TREATMENT:                                                                                                                              DATE:  08/13/23  EVAL  Neuromuscular re-education: Pt provides verbal consent for internal vaginal/rectal pelvic floor exam. Internal vaginal pelvic floor muscle contraction training Quick flicks Long holds Urge drill Therapeutic activities: Timing of nightly fluids Coconut oil  for moisturizing perineum    PATIENT EDUCATION:  Education details: See above Person educated: Patient Education method: Explanation, Demonstration, Tactile cues, Verbal cues, and Handouts Education comprehension: verbalized understanding  HOME EXERCISE PROGRAM: [id number]  ASSESSMENT:  CLINICAL IMPRESSION: Patient is a 78 y.o. female who was seen today for physical therapy evaluation and treatment for urinary urgency and  incontinence.Exam findings notable for decreased lumbar A/ROM, abnormal posture, good core strength, significant abdominal scar tissue restriction, apical breathing pattern, perineal irritation, pelvic floor muscle weakness and decreased endurance, poor coordination with pelvic floor muscle contractions and appropriate pressure management. Signs and symptoms are most consistent with pelvic floor muscle weakness and poor coordination. Initial treatment consisted of pelvic floor muscle contraction training, urge drill, coconut oil for increased moisture, and correctly nightly timing of fluids. She will continue to benefit from skilled PT intervention in order to decrease urinary urgency/incontinence, address impairments, and progress functional strengthening program.   OBJECTIVE IMPAIRMENTS: decreased activity tolerance, decreased coordination, decreased endurance, decreased mobility, decreased ROM, decreased strength, increased fascial restrictions, increased muscle spasms, impaired flexibility, impaired tone, improper body mechanics, postural dysfunction, and pain.   ACTIVITY LIMITATIONS: continence  PARTICIPATION LIMITATIONS: community activity and occupation  PERSONAL FACTORS: 1 comorbidity: medical history are also affecting patient's functional outcome.   REHAB POTENTIAL: Good  CLINICAL DECISION MAKING: Stable/uncomplicated  EVALUATION COMPLEXITY: Low   GOALS: Goals reviewed with patient? Yes  SHORT TERM GOALS: Target date: 10/21/2023    Pt will be independent with HEP.   Baseline: Goal status: INITIAL  2.  Pt will report 25% improvement in urinary incontinence is order to avoid unwanted odor.  Baseline:  Goal status: INITIAL  3.  Pt will be independent with urge drill and double voiding techniques in order to help reduce leaking with walking into the house and urgency.  Baseline:  Goal status: INITIAL  4.  Pt will improve pelvic floor muscle strength to 3/5 without gluteal  co-contraction in order to improve urinary incontinence.  Baseline:  Goal status: INITIAL    LONG TERM GOALS: Target date: 12/16/23  Pt will be independent with advanced HEP.   Baseline:  Goal status: INITIAL  2.  Pt will be able to walk into home without triggering urinary incontinence.  Baseline:  Goal status: INITIAL  3.  Pt will report 75% improvement in urinary incontinence to avoid unwanted odors.  Baseline:  Goal status: INITIAL  4.  Pt will reduce pad use to 0-1 a day in order to reduce perineal skin irritation.  Baseline:  Goal status: INITIAL  5.  Pt will improve pelvic floor muscle endurance to greater than 10 seconds in order to reduce incontinence with walking.  Baseline:  Goal status: INITIAL   PLAN:  PT FREQUENCY: 1-2x/week  PT DURATION: 6 months  PLANNED INTERVENTIONS: 97110-Therapeutic exercises, 97530- Therapeutic activity, 97112- Neuromuscular re-education, 97535- Self Care, 11914- Manual therapy, Dry Needling, and Biofeedback  PLAN FOR NEXT SESSION: Consider reduce to help make sure patient is not bearing down on bladder with core contraction; begin core training.   Verlena Glenn, PT, DPT05/07/251:17 PM

## 2023-09-23 NOTE — ED Triage Notes (Signed)
 Patient c/o hypertension 180/100 at home. Patient report non compliant with BP medication for the past week.Patient denies headache, dizziness. Patient denies Chest pain and SOB.

## 2023-09-26 DIAGNOSIS — I10 Essential (primary) hypertension: Secondary | ICD-10-CM | POA: Diagnosis not present

## 2023-09-26 DIAGNOSIS — N3281 Overactive bladder: Secondary | ICD-10-CM | POA: Diagnosis not present

## 2023-09-26 DIAGNOSIS — F439 Reaction to severe stress, unspecified: Secondary | ICD-10-CM | POA: Diagnosis not present

## 2023-10-08 DIAGNOSIS — I10 Essential (primary) hypertension: Secondary | ICD-10-CM | POA: Diagnosis not present

## 2023-10-17 ENCOUNTER — Other Ambulatory Visit: Payer: Self-pay

## 2023-10-17 ENCOUNTER — Encounter (HOSPITAL_COMMUNITY): Payer: Self-pay

## 2023-10-17 ENCOUNTER — Emergency Department (HOSPITAL_COMMUNITY)

## 2023-10-17 ENCOUNTER — Emergency Department (HOSPITAL_COMMUNITY)
Admission: EM | Admit: 2023-10-17 | Discharge: 2023-10-17 | Disposition: A | Discharge status: Home / Self Care | Attending: Emergency Medicine | Admitting: Emergency Medicine

## 2023-10-17 ENCOUNTER — Encounter (HOSPITAL_COMMUNITY)

## 2023-10-17 DIAGNOSIS — M79662 Pain in left lower leg: Secondary | ICD-10-CM

## 2023-10-17 DIAGNOSIS — M79605 Pain in left leg: Secondary | ICD-10-CM | POA: Diagnosis not present

## 2023-10-17 NOTE — ED Provider Notes (Signed)
 Care assumed from previous provider at shift change. Patient here with pain to left leg. No known trauma. NV intact. Concern for DVT, FU on US . Anticipate likely dc home. Physical Exam  BP 132/76 (BP Location: Right Arm)   Pulse 75   Temp 98.1 F (36.7 C) (Oral)   Resp 16   Ht 5' 6 (1.676 m)   Wt 63.5 kg   SpO2 98%   BMI 22.60 kg/m   Physical Exam  Procedures  Procedures Labs Reviewed - No data to display VAS US  LOWER EXTREMITY VENOUS (DVT) (ONLY [facility] & [facility]) Result Date: 10/17/2023  Lower Venous DVT Study Patient Name:  Zoe Jackson  Date of Exam:   10/17/2023 Medical Rec #: [id number]          Accession #:    [id number] Date of Birth: 1945-10-13          Patient Gender: F Patient Age:   78 years Exam Location:  [facility] Procedure:      VAS US  LOWER EXTREMITY VENOUS (DVT) Referring Phys: Kearston Putman --------------------------------------------------------------------------------  Indications: Patient noticed discoloration of leg while at a conference today.  Comparison Study: No prior study on file Performing Technologist: Alberta Lis RVS  Examination Guidelines: A complete evaluation includes B-mode imaging, spectral Doppler, color Doppler, and power Doppler as needed of all accessible portions of each vessel. Bilateral testing is considered an integral part of a complete examination. Limited examinations for reoccurring indications may be performed as noted. The reflux portion of the exam is performed with the patient in reverse Trendelenburg.  +-----+---------------+---------+-----------+----------+--------------+ RIGHTCompressibilityPhasicitySpontaneityPropertiesThrombus Aging +-----+---------------+---------+-----------+----------+--------------+ CFV  Full           Yes      Yes                                 +-----+---------------+---------+-----------+----------+--------------+ SFJ  Full                                                         +-----+---------------+---------+-----------+----------+--------------+   +---------+---------------+---------+-----------+----------+--------------+ LEFT     CompressibilityPhasicitySpontaneityPropertiesThrombus Aging +---------+---------------+---------+-----------+----------+--------------+ CFV      Full           Yes      Yes                                 +---------+---------------+---------+-----------+----------+--------------+ SFJ      Full                                                        +---------+---------------+---------+-----------+----------+--------------+ FV Prox  Full           Yes      Yes                                 +---------+---------------+---------+-----------+----------+--------------+ FV Mid   Full                                                        +---------+---------------+---------+-----------+----------+--------------+  FV DistalFull           Yes      Yes                                 +---------+---------------+---------+-----------+----------+--------------+ PFV      Full           Yes      Yes                                 +---------+---------------+---------+-----------+----------+--------------+ POP      Full           Yes      Yes                                 +---------+---------------+---------+-----------+----------+--------------+ PTV      Full                                                        +---------+---------------+---------+-----------+----------+--------------+ PERO     Full                                                        +---------+---------------+---------+-----------+----------+--------------+ Gastroc  Full                                                        +---------+---------------+---------+-----------+----------+--------------+    Summary: RIGHT: - No evidence of common femoral vein obstruction.   LEFT: - There is no evidence of deep vein thrombosis in  the lower extremity.  - No cystic structure found in the popliteal fossa.  *See table(s) above for measurements and observations. Electronically signed by Gaile New MD on 10/17/2023 at 10:00:33 PM.    Final     ED Course / MDM   Care assumed from previous provider at shift change. Patient here with pain to left leg. No known trauma. NV intact. Concern for DVT, FU on US . Anticipate likely dc home.  Imaging personally viewed and interpreted; US  neg for DVT  Patient reassessed. States she thinks now that she probably bumped her leg a few days ago and that is likely where the pain is coming from.  She has no bony pain.  She is neurologically intact.  Will have her follow-up outpatient, return for any worsening symptoms. No CP, SOB.  Low suspicion for fracture, dislocation, septic joint, DVT, ischemia, bacterial infectious process.  The patient has been appropriately medically screened and/or stabilized in the ED. I have low suspicion for any other emergent medical condition which would require further screening, evaluation or treatment in the ED or require inpatient management.  Patient is hemodynamically stable and in no acute distress.  Patient able to ambulate in department prior to ED.  Evaluation does not show acute pathology that would require ongoing or additional emergent  interventions while in the emergency department or further inpatient treatment.  I have discussed the diagnosis with the patient and answered all questions.  Pain is been managed while in the emergency department and patient has no further complaints prior to discharge.  Patient is comfortable with plan discussed in room and is stable for discharge at this time.  I have discussed strict return precautions for returning to the emergency department.  Patient was encouraged to follow-up with PCP/specialist refer to at discharge.     Clinical Course as of 10/17/23 2223  Fri Oct 17, 2023  1521 Pending DVT US - anticipate  likely dc home [BH]    Clinical Course User Index [BH] Clifford Coudriet A, PA-C   Medical Decision Making Amount and/or Complexity of Data Reviewed External Data Reviewed: labs, radiology and notes. Radiology: ordered and independent interpretation performed. Decision-making details documented in ED Course.          Thurlow Gallaga A, PA-C 10/17/23 2223    Cottie Donnice PARAS, MD 10/17/23 2224

## 2023-10-17 NOTE — Progress Notes (Signed)
 VASCULAR LAB    Left lower extremity venous duplex has been performed.  See CV proc for preliminary results.  Gave verbal report to [employer], PA-C  [place], RVT 10/17/2023, 5:31 PM

## 2023-10-17 NOTE — Discharge Instructions (Signed)
 It was a pleasure taking care of you here today.  Make sure to follow-up outpatient, return for any worsening symptoms.

## 2023-10-17 NOTE — ED Provider Notes (Signed)
 [place] EMERGENCY DEPARTMENT AT [facility] Provider Note   CSN: [id number] Arrival date & time: 10/17/23  1106     Patient presents with: Skin Discoloration   Zoe Jackson is a 78 y.o. female.  {Add pertinent medical, surgical, social history, OB history to YEP:67052} Patient with noncontributory past medical history presents today with complaints of left lower leg pain. Reports that she first noticed a slight discoloration on her left calf yesterday. It was not painful and so she did not worry about it, however she did not remember hitting it on anything and so thought it was strange. Today she was driving and noted it was mildly sore, was then with friends who told her she could have a DVT and therefore recommended she come in for evaluation. Presents for same. Denies history of DVT, she is not anticoagulated.   The history is provided by the patient. No language interpreter was used.       Prior to Admission medications   Medication Sig Start Date End Date Taking? Authorizing Provider  amLODipine  (NORVASC ) 5 MG tablet Take 1 tablet (5 mg total) by mouth daily. 09/23/23   Randol Simmonds, MD  B Complex-C-E-Zn (B COMPLEX-C-E-ZINC) tablet Take 1 tablet by mouth daily.    [provider]  BIOTIN PO Take by mouth every other day.    [provider]  Cholecalciferol (VITAMIN D ) 2000 units CAPS Take by mouth as directed.    [provider]  Coenzyme Q10 (CO Q 10 PO) Take 1 tablet by mouth daily.    [provider]  Coenzyme Q10 (CO Q-10) 100 MG CAPS Take 100 mg by mouth See admin instructions.    [provider]  Magnesium 300 MG CAPS Take by mouth as directed.    [provider]  Menaquinone-7 (VITAMIN K2) 100 MCG CAPS Take 100 mcg by mouth See admin instructions.    [provider]  Misc Natural Products (LUTEIN 20 PO) Take by mouth.    [provider]  OVER THE COUNTER MEDICATION Take 1 each by mouth  daily.    [provider]  OVER THE COUNTER MEDICATION Take 4 capsules by mouth daily in the afternoon. Hibiscus Flower    [provider]  OVER THE COUNTER MEDICATION Take 1 capsule by mouth daily in the afternoon. Olive Leaf & Oregano Immune Wellness    [provider]  OVER THE COUNTER MEDICATION Take 1 capsule by mouth daily in the afternoon. Synergistic Eye Health    [provider]  OVER THE COUNTER MEDICATION Take 100 mg by mouth daily in the afternoon. Ubiquinol    [provider]  QUERCETIN PO Take 1 tablet by mouth daily.    [provider]    Allergies: Azithromycin, Lisinopril, Other, and Sulfa antibiotics    Review of Systems  Skin:  Positive for color change.  All other systems reviewed and are negative.   Updated Vital Signs BP (!) 166/89 (BP Location: Right Arm)   Pulse 78   Temp 98.1 F (36.7 C) (Oral)   Resp 18   Ht 5' 6 (1.676 m)   Wt 63.5 kg   SpO2 100%   BMI 22.60 kg/m   Physical Exam Vitals and nursing note reviewed.  Constitutional:      General: She is not in acute distress.    Appearance: Normal appearance. She is normal weight. She is not ill-appearing, toxic-appearing or diaphoretic.  HENT:     Head:  Normocephalic and atraumatic.  Cardiovascular:     Rate and Rhythm: Normal rate.  Pulmonary:     Effort: Pulmonary effort is normal. No respiratory distress.  Musculoskeletal:        General: Normal range of motion.     Cervical back: Normal range of motion.  Skin:    General: Skin is warm and dry.     Comments: Extremely faint area of discolored skin noted to the left lateral calf area. No palpable cord or tenderness to palpation. Negative Homans sign. No deformity. DP and PT pulses intact and 2+  Neurological:     General: No focal deficit present.     Mental Status: She is alert.  Psychiatric:        Mood and Affect: Mood normal.        Behavior: Behavior normal.     (all labs ordered  are listed, but only abnormal results are displayed) Labs Reviewed - No data to display  EKG: None  Radiology: No results found.  {Document cardiac monitor, telemetry assessment procedure when appropriate:32947} Procedures   Medications Ordered in the ED - No data to display  Clinical Course as of 10/17/23 1537  Fri Oct 17, 2023  1521 Pending DVT US - anticipate likely dc home [BH]    Clinical Course User Index [BH] Henderly, Britni A, PA-C   {Click here for ABCD2, HEART and other calculators REFRESH Note before signing:1}                              Medical Decision Making  ***  {Document critical care time when appropriate  Document review of labs and clinical decision tools ie CHADS2VASC2, etc  Document your independent review of radiology images and any outside records  Document your discussion with family members, caretakers and with consultants  Document social determinants of health affecting pt's care  Document your decision making why or why not admission, treatments were needed:32947:::1}   Final diagnoses:  None    ED Discharge Orders     None

## 2023-10-17 NOTE — ED Triage Notes (Signed)
 Pr arrived this morning reporting left lower leg discoloration noted this morning, none visible on this RN assessment. Denies known injury, recent travel or any pain to the area. States has been running around a lot the last few days. Was advised to come and get it evaluated. No other complaints reported.

## 2023-11-06 DIAGNOSIS — I10 Essential (primary) hypertension: Secondary | ICD-10-CM | POA: Diagnosis not present

## 2023-11-06 DIAGNOSIS — F4321 Adjustment disorder with depressed mood: Secondary | ICD-10-CM | POA: Diagnosis not present

## 2023-11-13 ENCOUNTER — Ambulatory Visit: Attending: Obstetrics and Gynecology

## 2023-11-13 DIAGNOSIS — R279 Unspecified lack of coordination: Secondary | ICD-10-CM | POA: Insufficient documentation

## 2023-11-13 DIAGNOSIS — R293 Abnormal posture: Secondary | ICD-10-CM | POA: Insufficient documentation

## 2023-11-13 DIAGNOSIS — M6281 Muscle weakness (generalized): Secondary | ICD-10-CM | POA: Insufficient documentation

## 2023-11-13 DIAGNOSIS — M62838 Other muscle spasm: Secondary | ICD-10-CM | POA: Insufficient documentation

## 2023-11-13 NOTE — Therapy (Signed)
 OUTPATIENT PHYSICAL THERAPY FEMALE PELVIC TREATMENT   Patient Name: Zoe Jackson MRN: [id number] DOB:June 02, 1945, 78 y.o., female Today's Date: 11/13/2023  END OF SESSION:  PT End of Session - 11/13/23 0850     Visit Number 2    Date for PT Re-Evaluation 02/05/24    Authorization Type HTA    PT Start Time 0847    PT Stop Time 0926    PT Time Calculation (min) 39 min           Past Medical History:  Diagnosis Date   MVP (mitral valve prolapse)    Ovarian cancer (HCC)    Also had radiation therapy   SBO (small bowel obstruction) (HCC)    Past Surgical History:  Procedure Laterality Date   ABDOMINAL EXPLORATION SURGERY  2007   LOA   ABDOMINAL HYSTERECTOMY  40 yrs ago   TAH   CHOLECYSTECTOMY     ERCP N/A 03/14/2013   Procedure: ENDOSCOPIC RETROGRADE CHOLANGIOPANCREATOGRAPHY (ERCP);  Surgeon: Norleen JAYSON Hint, MD;  Location: [facility] ENDOSCOPY;  Service: Endoscopy;  Laterality: N/A;   Patient Active Problem List   Diagnosis Date Noted   Overactive bladder 07/21/2023   Essential hypertension 08/12/2021   Elevated coronary artery calcium score 08/12/2021   Genetic testing 02/22/2021   Ovarian cancer (HCC) 02/07/2021   Chest pain 09/23/2017   Abnormal EKG 09/23/2017   MVP (mitral valve prolapse) 05/25/2014   Pancreatitis 03/12/2013   Pancreatitis, acute 03/12/2013   Hypokalemia 03/12/2013   Hyponatremia 03/12/2013   Transaminitis 03/12/2013   Cholelithiasis 03/12/2013   GERD (gastroesophageal reflux disease) 03/12/2013    PCP: Teresa Channel, MD  REFERRING PROVIDER: Marilynne Rosaline SAILOR, MD   REFERRING DIAG: N32.81 (ICD-10-CM) - Overactive bladder  THERAPY DIAG:  Other muscle spasm  Unspecified lack of coordination  Muscle weakness (generalized)  Abnormal posture  Rationale for Evaluation and Treatment: Rehabilitation  ONSET DATE: Several years  SUBJECTIVE:                                                                                                                                                                                            SUBJECTIVE STATEMENT: Pt feels like she has made some improvements. He has been stopping all food and water intake after 6pm and feels like that is helping to control nocturia.    PAIN:  Are you having pain? No   PRECAUTIONS: Other: history of radiation therapy for ovarian cancer   RED FLAGS: None   WEIGHT BEARING RESTRICTIONS: No  FALLS:  Has patient fallen in last 6 months? No  OCCUPATION: House cleaning business, gardens and volunteers at a garden  ACTIVITY LEVEL : walks 3 miles a day  PLOF: Independent  PATIENT GOALS: decrease urinary urgency/leaking  PERTINENT HISTORY:  Ovarian cancer with radiation, small bowel obstruction, abdominal exploration surgery 2007, abdominal hysterectomy, cholecystectomy   BOWEL MOVEMENT: Pain with bowel movement: No Type of bowel movement:Frequency 3-4x/day and Strain no Fully empty rectum: No Leakage: No Pads: Yes: see below Fiber supplement/laxative No  URINATION: Pain with urination: No Fully empty bladder: Yes:   Stream: it will stop and she has to press to finish Urgency: Yes  Frequency: 5-6x/day, 3-4x/night Fluid Intake: herbal tea, water - drinks throughout the night Leakage: Urge to void and Walking to the bathroom Pads: Yes: 1 pad a day  INTERCOURSE:  Not sexually active, no history of pain  PREGNANCY: NA  PROLAPSE: None   OBJECTIVE:  Note: Objective measures were completed at Evaluation unless otherwise noted.  11/13/23: No pelvic floor muscle re-assessment performed this session  Transversus abdominus contraction: able to activate bil, but slow to activate; able to perform with breathing Abdominal pressure management: no evidence of bearing down with pelvic floor muscle contraction and good drawing in from external observation Curl-up test: good drawing in and no bulging PFIQ-7: 24  Eval: PATIENT SURVEYS:   PFIQ-7:  33  COGNITION: Overall cognitive status: Within functional limits for tasks assessed     SENSATION: Light touch: Appears intact   FUNCTIONAL TESTS:  Squat:all the way down, heels up, Lt weight shift Single leg stance:  Rt: WNL  Lt: WNL  Curl-up test: WNL Transversus abdominus test: pushing down and out   GAIT: Assistive device utilized: None Comments: WNL  POSTURE: rounded shoulders, forward head, decreased lumbar lordosis, increased thoracic kyphosis, and posterior pelvic tilt   LUMBARAROM/PROM:  A/PROM A/PROM  Eval (% available)  Flexion 80  Extension 50  Right lateral flexion 75  Left lateral flexion 75  Right rotation 75  Left rotation 75   (Blank rows = not tested)  PALPATION:   General: WNL  Pelvic Alignment: WNL  Abdominal: significant scar tissue restriction, apical breathing pattern                External Perineal Exam: irritation and redness in perineum                              Internal Pelvic Floor: stenosis, no tenderness    Patient confirms identification and approves PT to assess internal pelvic floor and treatment Yes  PELVIC MMT:   MMT eval  Vaginal 2/5, 4 seconds, 6 repeat contractions, poor coordination, unable to bear down  Diastasis Recti WNL  (Blank rows = not tested)        TONE: low  PROLAPSE: Unable to bear down due to difficulty with coordination - could not assess vaginal wall laxity  TODAY'S TREATMENT:  DATE:  11/13/23 RE-EVAL Neuromuscular re-education: Transversus abdominus training with multimodal cues for improved motor control and breath coordination Bil supine UE ball press with transversus abdominus and pelvic floor muscle contractions and breath coordination 10x Supine hip adduction ball press with transversus abdominus and pelvic floor muscle contractions and breath coordination  10x Bridge with hip adduction, transversus abdominus, and pelvic floor muscle 2 x 10 Supine resisted march 10x bil  Seated hip adduction ball press with transversus abdominus and pelvic floor muscle 2 x 10 Seated hip abduction red band with transversus abdominus and pelvic floor muscle 2 x 10 Seated resisted march red band with transversus abdominus and pelvic floor muscle 2 x 10  08/13/23  EVAL  Neuromuscular re-education: Pt provides verbal consent for internal vaginal/rectal pelvic floor exam. Internal vaginal pelvic floor muscle contraction training Quick flicks Long holds Urge drill Therapeutic activities: Timing of nightly fluids Coconut oil for moisturizing perineum    PATIENT EDUCATION:  Education details: See above Person educated: Patient Education method: Programmer, multimedia, Demonstration, Actor cues, Verbal cues, and Handouts Education comprehension: verbalized understanding  HOME EXERCISE PROGRAM: [id number]  ASSESSMENT:  CLINICAL IMPRESSION: Patient is a 78 y.o. female who was seen today for physical therapy evaluation and treatment for urinary urgency and incontinence. Pt is doing very well overall with reduction in urinary urgency and nocturia. This is only her second visit, but she has seen at least 25% improvement in bladder control. Nightly timing of fluids has been very helpful. She was able to begin core training today and demonstrated good transversus abdominus activation strength, but the muscle was initially slow to turn on; this improved as she performed with other exercises. Good tolerance to all activities and we discussed the purpose of connecting pelvic floor muscles with abdominals in order to see more functional improvement in condition and allow pelvic floor muscles to work against gravity. Pt is seeing progress with urinary urgency and nocturia. She will continue to benefit from skilled PT intervention in order to decrease urinary urgency/incontinence, address  impairments, and progress functional strengthening program.   OBJECTIVE IMPAIRMENTS: decreased activity tolerance, decreased coordination, decreased endurance, decreased mobility, decreased ROM, decreased strength, increased fascial restrictions, increased muscle spasms, impaired flexibility, impaired tone, improper body mechanics, postural dysfunction, and pain.   ACTIVITY LIMITATIONS: continence  PARTICIPATION LIMITATIONS: community activity and occupation  PERSONAL FACTORS: 1 comorbidity: medical history are also affecting patient's functional outcome.   REHAB POTENTIAL: Good  CLINICAL DECISION MAKING: Stable/uncomplicated  EVALUATION COMPLEXITY: Low   GOALS: Goals reviewed with patient? Yes  SHORT TERM GOALS: Updated 11/13/23    Pt will be independent with HEP.   Baseline: Goal status: MET 11/13/23  2.  Pt will report 25% improvement in urinary incontinence is order to avoid unwanted odor.  Baseline:  Goal status: MET 11/13/23  3.  Pt will be independent with urge drill and double voiding techniques in order to help reduce leaking with walking into the house and urgency.  Baseline:  Goal status: MET 11/13/23  4.  Pt will improve pelvic floor muscle strength to 3/5 without gluteal co-contraction in order to improve urinary incontinence.  Baseline:  Goal status: IN PROGRESS 11/13/23    LONG TERM GOALS: Updated 11/13/23  Pt will be independent with advanced HEP.   Baseline:  Goal status: IN PROGRESS 11/13/23  2.  Pt will be able to walk into home without triggering urinary incontinence.  Baseline:  Goal status: MET 11/13/23  3.  Pt will report 75% improvement in  urinary incontinence to avoid unwanted odors.  Baseline:  Goal status: IN PROGRESS 11/13/23  4.  Pt will reduce pad use to 0-1 a day in order to reduce perineal skin irritation.  Baseline:  Goal status: IN PROGRESS 11/13/23  5.  Pt will improve pelvic floor muscle endurance to greater than 10 seconds in order to  reduce incontinence with walking.  Baseline:  Goal status: IN PROGRESS 11/13/23   PLAN:  PT FREQUENCY: 1-2x/week  PT DURATION: 12 weeks  PLANNED INTERVENTIONS: 97110-Therapeutic exercises, 97530- Therapeutic activity, 97112- Neuromuscular re-education, 97535- Self Care, [id number]- Manual therapy, Dry Needling, and Biofeedback  PLAN FOR NEXT SESSION: Progress core training.   Josette Mares, PT, DPT05/07/251:17 PM

## 2023-12-03 DIAGNOSIS — J988 Other specified respiratory disorders: Secondary | ICD-10-CM | POA: Diagnosis not present

## 2023-12-03 DIAGNOSIS — R5383 Other fatigue: Secondary | ICD-10-CM | POA: Diagnosis not present

## 2023-12-03 DIAGNOSIS — Z03818 Encounter for observation for suspected exposure to other biological agents ruled out: Secondary | ICD-10-CM | POA: Diagnosis not present

## 2023-12-04 DIAGNOSIS — L570 Actinic keratosis: Secondary | ICD-10-CM | POA: Diagnosis not present

## 2023-12-04 DIAGNOSIS — L821 Other seborrheic keratosis: Secondary | ICD-10-CM | POA: Diagnosis not present

## 2023-12-04 DIAGNOSIS — D225 Melanocytic nevi of trunk: Secondary | ICD-10-CM | POA: Diagnosis not present

## 2023-12-04 DIAGNOSIS — Z7189 Other specified counseling: Secondary | ICD-10-CM | POA: Diagnosis not present

## 2023-12-04 DIAGNOSIS — L814 Other melanin hyperpigmentation: Secondary | ICD-10-CM | POA: Diagnosis not present

## 2023-12-06 DIAGNOSIS — I10 Essential (primary) hypertension: Secondary | ICD-10-CM | POA: Diagnosis not present

## 2023-12-07 DIAGNOSIS — I10 Essential (primary) hypertension: Secondary | ICD-10-CM | POA: Diagnosis not present

## 2023-12-07 DIAGNOSIS — F4321 Adjustment disorder with depressed mood: Secondary | ICD-10-CM | POA: Diagnosis not present

## 2023-12-18 ENCOUNTER — Ambulatory Visit: Attending: Obstetrics and Gynecology

## 2023-12-18 DIAGNOSIS — M62838 Other muscle spasm: Secondary | ICD-10-CM | POA: Insufficient documentation

## 2023-12-18 DIAGNOSIS — R279 Unspecified lack of coordination: Secondary | ICD-10-CM | POA: Diagnosis not present

## 2023-12-18 DIAGNOSIS — M6281 Muscle weakness (generalized): Secondary | ICD-10-CM | POA: Diagnosis not present

## 2023-12-18 DIAGNOSIS — R293 Abnormal posture: Secondary | ICD-10-CM | POA: Diagnosis not present

## 2023-12-18 NOTE — Therapy (Signed)
 OUTPATIENT PHYSICAL THERAPY FEMALE PELVIC TREATMENT   Patient Name: Zoe Jackson MRN: [id number] DOB:09/14/1945, 78 y.o., female Today's Date: 12/18/2023  END OF SESSION:  PT End of Session - 12/18/23 0849     Visit Number 3    Date for PT Re-Evaluation 02/05/24    Authorization Type HTA    PT Start Time 0846    PT Stop Time 0924    PT Time Calculation (min) 38 min    Activity Tolerance Patient tolerated treatment well    Behavior During Therapy [facility] for tasks assessed/performed            Past Medical History:  Diagnosis Date   MVP (mitral valve prolapse)    Ovarian cancer (HCC)    Also had radiation therapy   SBO (small bowel obstruction) (HCC)    Past Surgical History:  Procedure Laterality Date   ABDOMINAL EXPLORATION SURGERY  2007   LOA   ABDOMINAL HYSTERECTOMY  40 yrs ago   TAH   CHOLECYSTECTOMY     ERCP N/A 03/14/2013   Procedure: ENDOSCOPIC RETROGRADE CHOLANGIOPANCREATOGRAPHY (ERCP);  Surgeon: Norleen JAYSON Hint, MD;  Location: [facility] ENDOSCOPY;  Service: Endoscopy;  Laterality: N/A;   Patient Active Problem List   Diagnosis Date Noted   Overactive bladder 07/21/2023   Essential hypertension 08/12/2021   Elevated coronary artery calcium score 08/12/2021   Genetic testing 02/22/2021   Ovarian cancer (HCC) 02/07/2021   Chest pain 09/23/2017   Abnormal EKG 09/23/2017   MVP (mitral valve prolapse) 05/25/2014   Pancreatitis 03/12/2013   Pancreatitis, acute 03/12/2013   Hypokalemia 03/12/2013   Hyponatremia 03/12/2013   Transaminitis 03/12/2013   Cholelithiasis 03/12/2013   GERD (gastroesophageal reflux disease) 03/12/2013    PCP: Teresa Channel, MD  REFERRING PROVIDER: Marilynne Rosaline SAILOR, MD   REFERRING DIAG: N32.81 (ICD-10-CM) - Overactive bladder  THERAPY DIAG:  Other muscle spasm  Unspecified lack of coordination  Muscle weakness (generalized)  Abnormal posture  Rationale for Evaluation and Treatment: Rehabilitation  ONSET DATE: Several  years  SUBJECTIVE:                                                                                                                                                                                           SUBJECTIVE STATEMENT: Pt states that she is doing very well. After leaving here last time, she went walking in the park and decided not to use the bathroom until 3 miles in. She reports that this was a turning point for her and she feels like she is not having any issues any more. She has stopped eating and drinking at night, but has  not noticed any improvement in nocturia - but also reports this does not bother her.    PAIN:  Are you having pain? No   PRECAUTIONS: Other: history of radiation therapy for ovarian cancer   RED FLAGS: None   WEIGHT BEARING RESTRICTIONS: No  FALLS:  Has patient fallen in last 6 months? No  OCCUPATION: House cleaning business, gardens and volunteers at a garden  ACTIVITY LEVEL : walks 3 miles a day  PLOF: Independent  PATIENT GOALS: decrease urinary urgency/leaking  PERTINENT HISTORY:  Ovarian cancer with radiation, small bowel obstruction, abdominal exploration surgery 2007, abdominal hysterectomy, cholecystectomy   BOWEL MOVEMENT: Pain with bowel movement: No Type of bowel movement:Frequency 3-4x/day and Strain no Fully empty rectum: No Leakage: No Pads: Yes: see below Fiber supplement/laxative No  URINATION: Pain with urination: No Fully empty bladder: Yes:   Stream: it will stop and she has to press to finish Urgency: Yes  Frequency: 5-6x/day, 3-4x/night Fluid Intake: herbal tea, water - drinks throughout the night Leakage: Urge to void and Walking to the bathroom Pads: Yes: 1 pad a day  INTERCOURSE:  Not sexually active, no history of pain  PREGNANCY: NA  PROLAPSE: None   OBJECTIVE:  Note: Objective measures were completed at Evaluation unless otherwise noted.  11/13/23: No pelvic floor muscle re-assessment performed  this session  Transversus abdominus contraction: able to activate bil, but slow to activate; able to perform with breathing Abdominal pressure management: no evidence of bearing down with pelvic floor muscle contraction and good drawing in from external observation Curl-up test: good drawing in and no bulging PFIQ-7: 24  Eval: PATIENT SURVEYS:   PFIQ-7: 33  COGNITION: Overall cognitive status: Within functional limits for tasks assessed     SENSATION: Light touch: Appears intact   FUNCTIONAL TESTS:  Squat:all the way down, heels up, Lt weight shift Single leg stance:  Rt: WNL  Lt: WNL  Curl-up test: WNL Transversus abdominus test: pushing down and out   GAIT: Assistive device utilized: None Comments: WNL  POSTURE: rounded shoulders, forward head, decreased lumbar lordosis, increased thoracic kyphosis, and posterior pelvic tilt   LUMBARAROM/PROM:  A/PROM A/PROM  Eval (% available)  Flexion 80  Extension 50  Right lateral flexion 75  Left lateral flexion 75  Right rotation 75  Left rotation 75   (Blank rows = not tested)  PALPATION:   General: WNL  Pelvic Alignment: WNL  Abdominal: significant scar tissue restriction, apical breathing pattern                External Perineal Exam: irritation and redness in perineum                              Internal Pelvic Floor: stenosis, no tenderness    Patient confirms identification and approves PT to assess internal pelvic floor and treatment Yes  PELVIC MMT:   MMT eval  Vaginal 2/5, 4 seconds, 6 repeat contractions, poor coordination, unable to bear down  Diastasis Recti WNL  (Blank rows = not tested)        TONE: low  PROLAPSE: Unable to bear down due to difficulty with coordination - could not assess vaginal wall laxity  TODAY'S TREATMENT:  DATE:  12/18/23 Neuromuscular  re-education: Seated horizontal abduction + red band 2 x 10 Seated resisted rotation + red band 10x bil Therapeutic activities: Standing shoulder extension + red band 2 x 10 Pallof press + red band 10x bil Standing rows + red band 2 x 10 3 way kick 10x each, bil Sit<>stand to table 10x   11/13/23 RE-EVAL Neuromuscular re-education: Transversus abdominus training with multimodal cues for improved motor control and breath coordination Bil supine UE ball press with transversus abdominus and pelvic floor muscle contractions and breath coordination 10x Supine hip adduction ball press with transversus abdominus and pelvic floor muscle contractions and breath coordination 10x Bridge with hip adduction, transversus abdominus, and pelvic floor muscle 2 x 10 Supine resisted march 10x bil  Seated hip adduction ball press with transversus abdominus and pelvic floor muscle 2 x 10 Seated hip abduction red band with transversus abdominus and pelvic floor muscle 2 x 10 Seated resisted march red band with transversus abdominus and pelvic floor muscle 2 x 10  08/13/23  EVAL  Neuromuscular re-education: Pt provides verbal consent for internal vaginal/rectal pelvic floor exam. Internal vaginal pelvic floor muscle contraction training Quick flicks Long holds Urge drill Therapeutic activities: Timing of nightly fluids Coconut oil for moisturizing perineum    PATIENT EDUCATION:  Education details: See above Person educated: Patient Education method: Programmer, multimedia, Demonstration, Actor cues, Verbal cues, and Handouts Education comprehension: verbalized understanding  HOME EXERCISE PROGRAM: [id number]  ASSESSMENT:  CLINICAL IMPRESSION: Patient is a 78 y.o. female who was seen today for physical therapy evaluation and treatment for urinary urgency and incontinence. Pt is doing very well and feels prepared to discharge today. She is not having any issues with bladder during the day. She reports  having nocturia still, but it does not bother her at all. We reviewed urge drill when she wakes at night and how this may become more beneficial as she continues to get stronger. However, she feels like it is easier to just get up and go to the bathroom and go back to sleep. We progressed seated and standing exercises today to round out her HEP. She is prepared to D/C skilled PT intervention at this tine due to having met personal and rehab goals for physical therapy. She was encouraged to call with any questions or concerns.   OBJECTIVE IMPAIRMENTS: decreased activity tolerance, decreased coordination, decreased endurance, decreased mobility, decreased ROM, decreased strength, increased fascial restrictions, increased muscle spasms, impaired flexibility, impaired tone, improper body mechanics, postural dysfunction, and pain.   ACTIVITY LIMITATIONS: continence  PARTICIPATION LIMITATIONS: community activity and occupation  PERSONAL FACTORS: 1 comorbidity: medical history are also affecting patient's functional outcome.   REHAB POTENTIAL: Good  CLINICAL DECISION MAKING: Stable/uncomplicated  EVALUATION COMPLEXITY: Low   GOALS: Goals reviewed with patient? Yes  SHORT TERM GOALS: Updated 12/18/23    Pt will be independent with HEP.   Baseline: Goal status: MET 11/13/23  2.  Pt will report 25% improvement in urinary incontinence is order to avoid unwanted odor.  Baseline:  Goal status: MET 11/13/23  3.  Pt will be independent with urge drill and double voiding techniques in order to help reduce leaking with walking into the house and urgency.  Baseline:  Goal status: MET 11/13/23  4.  Pt will improve pelvic floor muscle strength to 3/5 without gluteal co-contraction in order to improve urinary incontinence.  Baseline:  Goal status: DISCHARGED 12/18/23    LONG TERM GOALS: Updated 12/18/23  Pt will be independent  with advanced HEP.   Baseline:  Goal status: MET 12/18/23  2.  Pt will  be able to walk into home without triggering urinary incontinence.  Baseline:  Goal status: MET 11/13/23  3.  Pt will report 75% improvement in urinary incontinence to avoid unwanted odors.  Baseline: Pt states that she is 90% better Goal status: MET 12/18/23  4.  Pt will reduce pad use to 0-1 a day in order to reduce perineal skin irritation.  Baseline:  Goal status: MET 12/18/23  5.  Pt will improve pelvic floor muscle endurance to greater than 10 seconds in order to reduce incontinence with walking.  Baseline:  Goal status: DISCHARGED 12/18/23   PLAN:  PT FREQUENCY: -  PT DURATION: -  PLANNED INTERVENTIONS: -  PLAN FOR NEXT SESSION: DC  PHYSICAL THERAPY DISCHARGE SUMMARY  Visits from Start of Care: 3  Current functional level related to goals / functional outcomes: Independent   Remaining deficits: See above   Education / Equipment: HEP   Patient agrees to discharge. Patient goals were partially met. Patient is being discharged due to being pleased with the current functional level.   Josette Mares, PT, DPT05/07/251:17 PM

## 2024-01-05 DIAGNOSIS — I10 Essential (primary) hypertension: Secondary | ICD-10-CM | POA: Diagnosis not present

## 2024-01-06 DIAGNOSIS — I10 Essential (primary) hypertension: Secondary | ICD-10-CM | POA: Diagnosis not present

## 2024-01-06 DIAGNOSIS — F4321 Adjustment disorder with depressed mood: Secondary | ICD-10-CM | POA: Diagnosis not present

## 2024-01-15 ENCOUNTER — Encounter

## 2024-01-29 DIAGNOSIS — L570 Actinic keratosis: Secondary | ICD-10-CM | POA: Diagnosis not present

## 2024-01-29 DIAGNOSIS — L244 Irritant contact dermatitis due to drugs in contact with skin: Secondary | ICD-10-CM | POA: Diagnosis not present

## 2024-02-04 DIAGNOSIS — I10 Essential (primary) hypertension: Secondary | ICD-10-CM | POA: Diagnosis not present

## 2024-02-06 DIAGNOSIS — F4321 Adjustment disorder with depressed mood: Secondary | ICD-10-CM | POA: Diagnosis not present

## 2024-02-06 DIAGNOSIS — I10 Essential (primary) hypertension: Secondary | ICD-10-CM | POA: Diagnosis not present

## 2024-03-02 DIAGNOSIS — L570 Actinic keratosis: Secondary | ICD-10-CM | POA: Diagnosis not present

## 2024-03-02 DIAGNOSIS — L244 Irritant contact dermatitis due to drugs in contact with skin: Secondary | ICD-10-CM | POA: Diagnosis not present

## 2024-03-05 DIAGNOSIS — I10 Essential (primary) hypertension: Secondary | ICD-10-CM | POA: Diagnosis not present

## 2024-03-07 DIAGNOSIS — F4321 Adjustment disorder with depressed mood: Secondary | ICD-10-CM | POA: Diagnosis not present

## 2024-03-07 DIAGNOSIS — I10 Essential (primary) hypertension: Secondary | ICD-10-CM | POA: Diagnosis not present

## 2024-03-08 DIAGNOSIS — I10 Essential (primary) hypertension: Secondary | ICD-10-CM | POA: Diagnosis not present

## 2024-03-08 DIAGNOSIS — E559 Vitamin D deficiency, unspecified: Secondary | ICD-10-CM | POA: Diagnosis not present

## 2024-03-11 ENCOUNTER — Other Ambulatory Visit (HOSPITAL_BASED_OUTPATIENT_CLINIC_OR_DEPARTMENT_OTHER): Payer: Self-pay | Admitting: Family Medicine

## 2024-03-11 DIAGNOSIS — E559 Vitamin D deficiency, unspecified: Secondary | ICD-10-CM | POA: Diagnosis not present

## 2024-03-11 DIAGNOSIS — R911 Solitary pulmonary nodule: Secondary | ICD-10-CM | POA: Diagnosis not present

## 2024-03-11 DIAGNOSIS — Z8543 Personal history of malignant neoplasm of ovary: Secondary | ICD-10-CM | POA: Diagnosis not present

## 2024-03-11 DIAGNOSIS — M8580 Other specified disorders of bone density and structure, unspecified site: Secondary | ICD-10-CM

## 2024-03-11 DIAGNOSIS — Z1331 Encounter for screening for depression: Secondary | ICD-10-CM | POA: Diagnosis not present

## 2024-03-11 DIAGNOSIS — I700 Atherosclerosis of aorta: Secondary | ICD-10-CM | POA: Diagnosis not present

## 2024-03-11 DIAGNOSIS — M8588 Other specified disorders of bone density and structure, other site: Secondary | ICD-10-CM | POA: Diagnosis not present

## 2024-03-11 DIAGNOSIS — Z0000 Encounter for general adult medical examination without abnormal findings: Secondary | ICD-10-CM | POA: Diagnosis not present

## 2024-03-11 DIAGNOSIS — D692 Other nonthrombocytopenic purpura: Secondary | ICD-10-CM | POA: Diagnosis not present

## 2024-03-11 DIAGNOSIS — I10 Essential (primary) hypertension: Secondary | ICD-10-CM | POA: Diagnosis not present

## 2024-03-11 DIAGNOSIS — Z860101 Personal history of adenomatous and serrated colon polyps: Secondary | ICD-10-CM | POA: Diagnosis not present
